# Patient Record
Sex: Female | Born: 1985 | Race: Black or African American | Hispanic: No | Marital: Single | Smoking: Current every day smoker
Health system: Southern US, Community
[De-identification: ages and names within clinical notes are randomized; demographics above are authoritative.]

## PROBLEM LIST (undated history)

## (undated) DIAGNOSIS — I10 Essential (primary) hypertension: Secondary | ICD-10-CM

## (undated) DIAGNOSIS — J302 Other seasonal allergic rhinitis: Secondary | ICD-10-CM

## (undated) HISTORY — PX: TUBAL LIGATION: SHX77

---
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
## 2003-06-28 ENCOUNTER — Emergency Department (HOSPITAL_COMMUNITY): Admission: EM | Admit: 2003-06-28 | Discharge: 2003-06-28 | Payer: Self-pay

## 2003-10-16 ENCOUNTER — Ambulatory Visit (HOSPITAL_COMMUNITY): Admission: RE | Admit: 2003-10-16 | Discharge: 2003-10-16 | Payer: Self-pay | Admitting: *Deleted

## 2004-01-12 ENCOUNTER — Ambulatory Visit (HOSPITAL_COMMUNITY): Admission: RE | Admit: 2004-01-12 | Discharge: 2004-01-12 | Payer: Self-pay | Admitting: *Deleted

## 2004-01-28 ENCOUNTER — Inpatient Hospital Stay (HOSPITAL_COMMUNITY): Admission: AD | Admit: 2004-01-28 | Discharge: 2004-01-30 | Payer: Self-pay | Admitting: Obstetrics & Gynecology

## 2004-07-27 ENCOUNTER — Emergency Department (HOSPITAL_COMMUNITY): Admission: EM | Admit: 2004-07-27 | Discharge: 2004-07-27 | Payer: Self-pay | Admitting: Emergency Medicine

## 2004-12-04 ENCOUNTER — Emergency Department (HOSPITAL_COMMUNITY): Admission: EM | Admit: 2004-12-04 | Discharge: 2004-12-04 | Payer: Self-pay | Admitting: Emergency Medicine

## 2005-04-09 ENCOUNTER — Inpatient Hospital Stay (HOSPITAL_COMMUNITY): Admission: AD | Admit: 2005-04-09 | Discharge: 2005-04-09 | Payer: Self-pay | Admitting: Obstetrics & Gynecology

## 2005-05-11 ENCOUNTER — Inpatient Hospital Stay (HOSPITAL_COMMUNITY): Admission: AD | Admit: 2005-05-11 | Discharge: 2005-05-11 | Payer: Self-pay | Admitting: Family Medicine

## 2005-05-15 ENCOUNTER — Emergency Department (HOSPITAL_COMMUNITY): Admission: EM | Admit: 2005-05-15 | Discharge: 2005-05-15 | Payer: Self-pay | Admitting: Emergency Medicine

## 2005-07-05 ENCOUNTER — Ambulatory Visit (HOSPITAL_COMMUNITY): Admission: RE | Admit: 2005-07-05 | Discharge: 2005-07-05 | Payer: Self-pay | Admitting: *Deleted

## 2005-09-29 ENCOUNTER — Ambulatory Visit (HOSPITAL_COMMUNITY): Admission: RE | Admit: 2005-09-29 | Discharge: 2005-09-29 | Payer: Self-pay | Admitting: *Deleted

## 2005-10-09 ENCOUNTER — Ambulatory Visit: Payer: Self-pay | Admitting: Family Medicine

## 2005-10-16 ENCOUNTER — Ambulatory Visit: Payer: Self-pay | Admitting: *Deleted

## 2005-10-18 ENCOUNTER — Inpatient Hospital Stay (HOSPITAL_COMMUNITY): Admission: AD | Admit: 2005-10-18 | Discharge: 2005-10-21 | Payer: Self-pay | Admitting: *Deleted

## 2005-10-18 ENCOUNTER — Ambulatory Visit: Payer: Self-pay | Admitting: Family Medicine

## 2007-01-13 ENCOUNTER — Inpatient Hospital Stay (HOSPITAL_COMMUNITY): Admission: AD | Admit: 2007-01-13 | Discharge: 2007-01-13 | Payer: Self-pay | Admitting: Obstetrics & Gynecology

## 2007-02-03 ENCOUNTER — Inpatient Hospital Stay (HOSPITAL_COMMUNITY): Admission: AD | Admit: 2007-02-03 | Discharge: 2007-02-03 | Payer: Self-pay | Admitting: Obstetrics & Gynecology

## 2007-02-06 ENCOUNTER — Ambulatory Visit (HOSPITAL_COMMUNITY): Admission: RE | Admit: 2007-02-06 | Discharge: 2007-02-06 | Payer: Self-pay | Admitting: Obstetrics & Gynecology

## 2007-02-08 ENCOUNTER — Ambulatory Visit (HOSPITAL_COMMUNITY): Admission: RE | Admit: 2007-02-08 | Discharge: 2007-02-08 | Payer: Self-pay | Admitting: Obstetrics & Gynecology

## 2007-03-17 ENCOUNTER — Inpatient Hospital Stay (HOSPITAL_COMMUNITY): Admission: AD | Admit: 2007-03-17 | Discharge: 2007-03-17 | Payer: Self-pay | Admitting: Obstetrics & Gynecology

## 2007-03-18 ENCOUNTER — Ambulatory Visit (HOSPITAL_COMMUNITY): Admission: RE | Admit: 2007-03-18 | Discharge: 2007-03-18 | Payer: Self-pay | Admitting: Obstetrics & Gynecology

## 2007-03-20 ENCOUNTER — Ambulatory Visit (HOSPITAL_COMMUNITY): Admission: RE | Admit: 2007-03-20 | Discharge: 2007-03-20 | Payer: Self-pay | Admitting: Family Medicine

## 2007-04-29 ENCOUNTER — Ambulatory Visit (HOSPITAL_COMMUNITY): Admission: RE | Admit: 2007-04-29 | Discharge: 2007-04-29 | Payer: Self-pay | Admitting: Family Medicine

## 2007-05-27 ENCOUNTER — Ambulatory Visit (HOSPITAL_COMMUNITY): Admission: RE | Admit: 2007-05-27 | Discharge: 2007-05-27 | Payer: Self-pay | Admitting: Family Medicine

## 2007-07-10 ENCOUNTER — Ambulatory Visit (HOSPITAL_COMMUNITY): Admission: RE | Admit: 2007-07-10 | Discharge: 2007-07-10 | Payer: Self-pay | Admitting: Family Medicine

## 2007-07-31 ENCOUNTER — Ambulatory Visit (HOSPITAL_COMMUNITY): Admission: RE | Admit: 2007-07-31 | Discharge: 2007-07-31 | Payer: Self-pay | Admitting: Family Medicine

## 2007-08-05 ENCOUNTER — Inpatient Hospital Stay (HOSPITAL_COMMUNITY): Admission: AD | Admit: 2007-08-05 | Discharge: 2007-08-07 | Payer: Self-pay | Admitting: Obstetrics & Gynecology

## 2007-08-05 ENCOUNTER — Ambulatory Visit: Payer: Self-pay | Admitting: *Deleted

## 2007-08-06 ENCOUNTER — Ambulatory Visit: Payer: Self-pay | Admitting: Obstetrics & Gynecology

## 2007-08-09 ENCOUNTER — Inpatient Hospital Stay (HOSPITAL_COMMUNITY): Admission: AD | Admit: 2007-08-09 | Discharge: 2007-08-10 | Payer: Self-pay | Admitting: Obstetrics and Gynecology

## 2007-08-09 ENCOUNTER — Ambulatory Visit: Payer: Self-pay | Admitting: Obstetrics and Gynecology

## 2008-06-25 ENCOUNTER — Emergency Department (HOSPITAL_COMMUNITY): Admission: EM | Admit: 2008-06-25 | Discharge: 2008-06-25 | Payer: Self-pay | Admitting: Emergency Medicine

## 2008-09-27 ENCOUNTER — Emergency Department (HOSPITAL_COMMUNITY): Admission: EM | Admit: 2008-09-27 | Discharge: 2008-09-27 | Payer: Self-pay | Admitting: Family Medicine

## 2008-12-29 ENCOUNTER — Emergency Department (HOSPITAL_COMMUNITY): Admission: EM | Admit: 2008-12-29 | Discharge: 2008-12-29 | Payer: Self-pay | Admitting: Emergency Medicine

## 2009-01-05 ENCOUNTER — Emergency Department (HOSPITAL_COMMUNITY): Admission: EM | Admit: 2009-01-05 | Discharge: 2009-01-05 | Payer: Self-pay | Admitting: Emergency Medicine

## 2009-03-24 ENCOUNTER — Emergency Department (HOSPITAL_COMMUNITY): Admission: EM | Admit: 2009-03-24 | Discharge: 2009-03-24 | Payer: Self-pay | Admitting: Family Medicine

## 2009-12-20 ENCOUNTER — Emergency Department (HOSPITAL_COMMUNITY): Admission: EM | Admit: 2009-12-20 | Discharge: 2009-12-21 | Payer: Self-pay | Admitting: Emergency Medicine

## 2010-03-29 ENCOUNTER — Emergency Department (HOSPITAL_COMMUNITY): Admission: EM | Admit: 2010-03-29 | Discharge: 2010-03-29 | Payer: Self-pay | Admitting: Emergency Medicine

## 2010-06-02 ENCOUNTER — Emergency Department (HOSPITAL_COMMUNITY): Admission: EM | Admit: 2010-06-02 | Discharge: 2010-06-02 | Payer: Self-pay | Admitting: Emergency Medicine

## 2010-12-04 ENCOUNTER — Encounter: Payer: Self-pay | Admitting: *Deleted

## 2011-01-28 LAB — POCT RAPID STREP A (OFFICE): Streptococcus, Group A Screen (Direct): NEGATIVE

## 2011-01-30 LAB — URINALYSIS, ROUTINE W REFLEX MICROSCOPIC
Hgb urine dipstick: NEGATIVE
Ketones, ur: NEGATIVE mg/dL
Nitrite: NEGATIVE
Urobilinogen, UA: 1 mg/dL (ref 0.0–1.0)

## 2011-01-30 LAB — DIFFERENTIAL
Eosinophils Absolute: 0.1 10*3/uL (ref 0.0–0.7)
Eosinophils Relative: 2 % (ref 0–5)
Lymphocytes Relative: 35 % (ref 12–46)
Lymphs Abs: 2.4 10*3/uL (ref 0.7–4.0)
Monocytes Relative: 8 % (ref 3–12)
Neutrophils Relative %: 56 % (ref 43–77)

## 2011-01-30 LAB — HEPATIC FUNCTION PANEL
ALT: 19 U/L (ref 0–35)
Albumin: 4 g/dL (ref 3.5–5.2)
Alkaline Phosphatase: 21 U/L — ABNORMAL LOW (ref 39–117)
Total Bilirubin: 0.8 mg/dL (ref 0.3–1.2)
Total Protein: 7.5 g/dL (ref 6.0–8.3)

## 2011-01-30 LAB — POCT I-STAT, CHEM 8
Calcium, Ion: 1.12 mmol/L (ref 1.12–1.32)
Chloride: 104 mEq/L (ref 96–112)
Creatinine, Ser: 0.9 mg/dL (ref 0.4–1.2)
Glucose, Bld: 102 mg/dL — ABNORMAL HIGH (ref 70–99)
HCT: 45 % (ref 36.0–46.0)
Potassium: 3.7 mEq/L (ref 3.5–5.1)

## 2011-01-30 LAB — CBC
HCT: 41 % (ref 36.0–46.0)
MCV: 89.3 fL (ref 78.0–100.0)
Platelets: 227 10*3/uL (ref 150–400)
RBC: 4.58 MIL/uL (ref 3.87–5.11)
WBC: 6.8 10*3/uL (ref 4.0–10.5)

## 2011-01-30 LAB — URINE MICROSCOPIC-ADD ON

## 2011-01-30 LAB — LIPASE, BLOOD: Lipase: 27 U/L (ref 11–59)

## 2011-02-21 LAB — GC/CHLAMYDIA PROBE AMP, GENITAL
Chlamydia, DNA Probe: NEGATIVE
GC Probe Amp, Genital: NEGATIVE

## 2011-02-21 LAB — POCT URINALYSIS DIP (DEVICE)
Bilirubin Urine: NEGATIVE
Glucose, UA: NEGATIVE mg/dL
Hgb urine dipstick: NEGATIVE
Ketones, ur: 15 mg/dL — AB
Specific Gravity, Urine: 1.025 (ref 1.005–1.030)
pH: 6.5 (ref 5.0–8.0)

## 2011-02-21 LAB — WET PREP, GENITAL: Trich, Wet Prep: NONE SEEN

## 2011-02-28 LAB — POCT URINALYSIS DIP (DEVICE)
Ketones, ur: NEGATIVE mg/dL
Nitrite: NEGATIVE
Protein, ur: NEGATIVE mg/dL
Urobilinogen, UA: 1 mg/dL (ref 0.0–1.0)
pH: 7 (ref 5.0–8.0)

## 2011-02-28 LAB — POCT PREGNANCY, URINE: Preg Test, Ur: NEGATIVE

## 2011-02-28 LAB — WET PREP, GENITAL: Trich, Wet Prep: NONE SEEN

## 2011-02-28 LAB — GC/CHLAMYDIA PROBE AMP, GENITAL: GC Probe Amp, Genital: NEGATIVE

## 2011-03-28 NOTE — Op Note (Signed)
NAMEJANELE, Heather Farrell                 ACCOUNT NO.:  [id number]   MEDICAL RECORD NO.:  [id number]          PATIENT TYPE:  INP   LOCATION:  9102                          FACILITY:  [facility]   PHYSICIAN:  Allie Bossier, MD        DATE OF BIRTH:  [birth year]   DATE OF PROCEDURE:  08/06/2007  DATE OF DISCHARGE:                               OPERATIVE REPORT   PREOPERATIVE DIAGNOSIS:  Desires permanent sterilization.   POSTOPERATIVE DIAGNOSIS:  Desires permanent sterilization.   PROCEDURE:  Postpartum bilateral tubal ligation with Filshie clips.   SURGEON:  Allie Bossier, M.D.   ASSISTANT:  Paticia Stack, M.D.   ANESTHESIA:  MAC and local.   COMPLICATIONS:  None.   ESTIMATED BLOOD LOSS:  Minimal.   FINDINGS:  Normal uterus, tubes and ovaries.   INDICATIONS FOR PROCEDURE:  This is a 25 year old gravida 4, para 4-0-0-  4 status post normal spontaneous vaginal delivery who desires permanent  sterilization.  The risks and benefits of the procedure were discussed  with the patient including the risk of failure of 1 in 100 and increased  risk of ectopic gestation if pregnancy occurs.   DESCRIPTION OF PROCEDURE:  The patient was taken to the operating room  where she was put under MAC anesthesia. She was prepped and draped in a  normal sterile fashion.  A small transverse infraumbilical skin incision  was made with the scalpel.  The incision was carried down through the  underlying fascia until the peritoneum was identified and entered. The  peritoneum was noted to be free of any adhesions and the incision was  extended with the Metzenbaum scissors.  The patient's right fallopian  tube was then identified, brought to the incision, and grasped with a  Babcock clamp.  This tube was then followed out to its individual  fimbria.  The Babcock clamp was then used to grasp the tube  approximately 4 cm from the cornual region and a Filshie clip was placed  in the isthmus region of the tube.  0.5 mL of  0.25% Marcaine was  injected on either side of the clip with excellent hemostasis noted.  The tube was returned to the abdomen. The patient's left fallopian tube  was then identified, brought to the incision, and grasped with the  Babcock clamp.  This tube was also followed out to its individual  fimbria.  The Babcock clamps were used to grasp the tube approximately 4  cm from the cornual region and a Filshie clip was placed in the isthmic  region of this tube. 0.5 mL of 0.25% Marcaine was also injected on  either side of the Filshie clip with good hemostasis noted.  The tube  was returned to the abdomen. The peritoneum and fascia was then closed  in a single layer using 3-0 Vicryl.  The skin was closed in a  subcuticular fashion using 3-0 Vicryl on the PAS2 needle. The patient  tolerated the procedure well.  Sponge, lap and needle counts were  correct x2.  The patient was taken to the recovery  room in stable  condition.     ______________________________  Paticia Stack, MD      Allie Bossier, MD  Electronically Signed    LNJ/MEDQ  D:  08/06/2007  T:  08/06/2007  Job:  [id number]

## 2011-08-11 LAB — URINALYSIS, ROUTINE W REFLEX MICROSCOPIC
Glucose, UA: NEGATIVE
Protein, ur: NEGATIVE
Specific Gravity, Urine: 1.026
Urobilinogen, UA: 1

## 2011-08-11 LAB — PREGNANCY, URINE: Preg Test, Ur: NEGATIVE

## 2011-08-24 LAB — RPR: RPR Ser Ql: NONREACTIVE

## 2011-11-01 ENCOUNTER — Emergency Department (INDEPENDENT_AMBULATORY_CARE_PROVIDER_SITE_OTHER)
Admission: EM | Admit: 2011-11-01 | Discharge: 2011-11-01 | Disposition: A | Discharge status: Home / Self Care | Payer: Self-pay | Source: Home / Self Care | Attending: Family Medicine | Admitting: Family Medicine

## 2011-11-01 ENCOUNTER — Encounter: Payer: Self-pay | Admitting: Emergency Medicine

## 2011-11-01 DIAGNOSIS — K047 Periapical abscess without sinus: Secondary | ICD-10-CM

## 2011-11-01 MED ORDER — IBUPROFEN 800 MG PO TABS: 800.0000 mg | ORAL_TABLET | Freq: Three times a day (TID) | ORAL | Status: AC

## 2011-11-01 MED ORDER — AMOXICILLIN 500 MG PO CAPS: 500.0000 mg | ORAL_CAPSULE | Freq: Three times a day (TID) | ORAL | Status: AC

## 2011-11-01 NOTE — ED Notes (Signed)
PT HERE WITH RIGHT UPPER TOOTH ABSCESS WITH SWELLING THAT STARTED X 4 DYS AGO.PT DENIES PAIN OR FEVER BUT STATES I CAN TASTE PUS DRAINAGE.NO REPORTS OF MALAISE

## 2011-11-01 NOTE — ED Provider Notes (Signed)
History     CSN: [id number] Arrival date & time: 11/01/2011  2:25 PM   First MD Initiated Contact with Patient 11/01/11 1342      Chief Complaint  Patient presents with  . Oral Swelling  . Abscess    (Consider location/radiation/quality/duration/timing/severity/associated sxs/prior treatment) Patient is a 24 y.o. female presenting with abscess. The history is provided by the patient.  Abscess  This is a new problem. The current episode started less than one week ago. The onset was gradual. The problem has been gradually worsening. The problem is mild. Pertinent negatives include no fever, no rhinorrhea and no sore throat.    History reviewed. No pertinent past medical history.  History reviewed. No pertinent past surgical history.  No family history on file.  History  Substance Use Topics  . Smoking status: Current Everyday Smoker  . Smokeless tobacco: Not on file  . Alcohol Use: Yes    OB History    Grav Para Term Preterm Abortions TAB SAB Ect Mult Living                  Review of Systems  Constitutional: Negative for fever.  HENT: Positive for dental problem. Negative for sore throat and rhinorrhea.     Allergies  Review of patient's allergies indicates no known allergies.  Home Medications   Current Outpatient Rx  Name Route Sig Dispense Refill  . AMOXICILLIN 500 MG PO CAPS Oral Take 1 capsule (500 mg total) by mouth 3 (three) times daily. 30 capsule 0  . IBUPROFEN 800 MG PO TABS Oral Take 1 tablet (800 mg total) by mouth 3 (three) times daily. 30 tablet 0    BP 120/53  Pulse 62  Temp(Src) 98 F (36.7 C) (Oral)  Resp 15  SpO2 100%  Physical Exam  Nursing note and vitals reviewed. Constitutional: She appears well-developed and well-nourished.  HENT:  Head: Normocephalic.  Right Ear: External ear normal.  Left Ear: External ear normal.  Mouth/Throat: Oropharynx is clear and moist.    Lymphadenopathy:    She has no cervical adenopathy.     ED Course  Procedures (including critical care time)  Labs Reviewed - No data to display No results found.   1. Dental abscess       MDM          Barkley Bruns, MD 11/01/11 1630

## 2012-01-04 ENCOUNTER — Emergency Department (HOSPITAL_COMMUNITY)
Admission: EM | Admit: 2012-01-04 | Discharge: 2012-01-04 | Disposition: A | Discharge status: Home Health | Payer: Self-pay | Source: Home / Self Care | Attending: Emergency Medicine | Admitting: Emergency Medicine

## 2012-01-04 ENCOUNTER — Encounter (HOSPITAL_COMMUNITY): Payer: Self-pay | Admitting: Emergency Medicine

## 2012-01-04 DIAGNOSIS — J069 Acute upper respiratory infection, unspecified: Secondary | ICD-10-CM

## 2012-01-04 HISTORY — DX: Other seasonal allergic rhinitis: J30.2

## 2012-01-04 MED ORDER — CETIRIZINE-PSEUDOEPHEDRINE ER 5-120 MG PO TB12: 1.0000 | ORAL_TABLET | Freq: Every day | ORAL | Status: DC

## 2012-01-04 MED ORDER — GUAIFENESIN-CODEINE 100-10 MG/5ML PO SYRP: 5.0000 mL | ORAL_SOLUTION | Freq: Three times a day (TID) | ORAL | Status: AC | PRN

## 2012-01-04 NOTE — Discharge Instructions (Signed)
Antibiotic Nonuse ° Your caregiver felt that the infection or problem was not one that would be helped with an antibiotic. °Infections may be caused by viruses or bacteria. Only a caregiver can tell which one of these is the likely cause of an illness. A cold is the most common cause of infection in both adults and children. A cold is a virus. Antibiotic treatment will have no effect on a viral infection. Viruses can lead to many lost days of work caring for sick children and many missed days of school. Children may catch as many as 10 "colds" or "flus" per year during which they can be tearful, cranky, and uncomfortable. The goal of treating a virus is aimed at keeping the ill person comfortable. °Antibiotics are medications used to help the body fight bacterial infections. There are relatively few types of bacteria that cause infections but there are hundreds of viruses. While both viruses and bacteria cause infection they are very different types of germs. A viral infection will typically go away by itself within 7 to 10 days. Bacterial infections may spread or get worse without antibiotic treatment. °Examples of bacterial infections are: °· Sore throats (like strep throat or tonsillitis).  °· Infection in the lung (pneumonia).  °· Ear and skin infections.  °Examples of viral infections are: °· Colds or flus.  °· Most coughs and bronchitis.  °· Sore throats not caused by Strep.  °· Runny noses.  °It is often best not to take an antibiotic when a viral infection is the cause of the problem. Antibiotics can kill off the helpful bacteria that we have inside our body and allow harmful bacteria to start growing. Antibiotics can cause side effects such as allergies, nausea, and diarrhea without helping to improve the symptoms of the viral infection. Additionally, repeated uses of antibiotics can cause bacteria inside of our body to become resistant. That resistance can be passed onto harmful bacterial. The next time  you have an infection it may be harder to treat if antibiotics are used when they are not needed. Not treating with antibiotics allows our own immune system to develop and take care of infections more efficiently. Also, antibiotics will work better for us when they are prescribed for bacterial infections. °Treatments for a child that is ill may include: °· Give extra fluids throughout the day to stay hydrated.  °· Get plenty of rest.  °· Only give your child over-the-counter or prescription medicines for pain, discomfort, or fever as directed by your caregiver.  °· The use of a cool mist humidifier may help stuffy noses.  °· Cold medications if suggested by your caregiver.  °Your caregiver may decide to start you on an antibiotic if: °· The problem you were seen for today continues for a longer length of time than expected.  °· You develop a secondary bacterial infection.  °SEEK MEDICAL CARE IF: °· Fever lasts longer than 5 days.  °· Symptoms continue to get worse after 5 to 7 days or become severe.  °· Difficulty in breathing develops.  °· Signs of dehydration develop (poor drinking, rare urinating, dark colored urine).  °· Changes in behavior or worsening tiredness (listlessness or lethargy).  °Document Released: 01/08/2002 Document Revised: 07/12/2011 Document Reviewed: 07/07/2009 °Exit[employer]® Patient Information ©2012 [employer].Antibiotic Nonuse ° Your caregiver felt that the infection or problem was not one that would be helped with an antibiotic. °Infections may be caused by viruses or bacteria. Only a caregiver can tell which one of these   is the likely cause of an illness. A cold is the most common cause of infection in both adults and children. A cold is a virus. Antibiotic treatment will have no effect on a viral infection. Viruses can lead to many lost days of work caring for sick children and many missed days of school. Children may catch as many as 10 "colds" or "flus" per year during which they can be  tearful, cranky, and uncomfortable. The goal of treating a virus is aimed at keeping the ill person comfortable. °Antibiotics are medications used to help the body fight bacterial infections. There are relatively few types of bacteria that cause infections but there are hundreds of viruses. While both viruses and bacteria cause infection they are very different types of germs. A viral infection will typically go away by itself within 7 to 10 days. Bacterial infections may spread or get worse without antibiotic treatment. °Examples of bacterial infections are: °· Sore throats (like strep throat or tonsillitis).  °· Infection in the lung (pneumonia).  °· Ear and skin infections.  °Examples of viral infections are: °· Colds or flus.  °· Most coughs and bronchitis.  °· Sore throats not caused by Strep.  °· Runny noses.  °It is often best not to take an antibiotic when a viral infection is the cause of the problem. Antibiotics can kill off the helpful bacteria that we have inside our body and allow harmful bacteria to start growing. Antibiotics can cause side effects such as allergies, nausea, and diarrhea without helping to improve the symptoms of the viral infection. Additionally, repeated uses of antibiotics can cause bacteria inside of our body to become resistant. That resistance can be passed onto harmful bacterial. The next time you have an infection it may be harder to treat if antibiotics are used when they are not needed. Not treating with antibiotics allows our own immune system to develop and take care of infections more efficiently. Also, antibiotics will work better for us when they are prescribed for bacterial infections. °Treatments for a child that is ill may include: °· Give extra fluids throughout the day to stay hydrated.  °· Get plenty of rest.  °· Only give your child over-the-counter or prescription medicines for pain, discomfort, or fever as directed by your caregiver.  °· The use of a cool mist  humidifier may help stuffy noses.  °· Cold medications if suggested by your caregiver.  °Your caregiver may decide to start you on an antibiotic if: °· The problem you were seen for today continues for a longer length of time than expected.  °· You develop a secondary bacterial infection.  °SEEK MEDICAL CARE IF: °· Fever lasts longer than 5 days.  °· Symptoms continue to get worse after 5 to 7 days or become severe.  °· Difficulty in breathing develops.  °· Signs of dehydration develop (poor drinking, rare urinating, dark colored urine).  °· Changes in behavior or worsening tiredness (listlessness or lethargy).  °Document Released: 01/08/2002 Document Revised: 07/12/2011 Document Reviewed: 07/07/2009 °ExitCare® Patient Information ©2012 [employer]. °

## 2012-01-04 NOTE — ED Notes (Signed)
Runny nose, cough, productive cough.  Onset Sunday of symptoms.  Patient reports scratchy throat.

## 2012-01-04 NOTE — ED Provider Notes (Signed)
History     CSN: [id number]  Arrival date & time 01/04/12  1755   First MD Initiated Contact with Patient 01/04/12 1816      Chief Complaint  Patient presents with  . URI    (Consider location/radiation/quality/duration/timing/severity/associated sxs/prior treatment) HPI Comments: Patient presents urgent care complaining of a productive cough with runny nose, symptoms started since Sunday. Has also a mild sore throat more like a burning sensation. No short of breath, no fevers today he had some fevers 2 days ago. Boyfriend has similar symptoms were ongoing for more than 2 weeks.  Patient is a 26 y.o. female presenting with URI. The history is provided by the patient.  URI The primary symptoms include fever, fatigue, ear pain, sore throat and cough. Primary symptoms do not include wheezing, abdominal pain, vomiting, myalgias or rash. The current episode started 3 to 5 days ago. This is a new problem.  The sore throat is not accompanied by trouble swallowing.  Symptoms associated with the illness include congestion and rhinorrhea.    Past Medical History  Diagnosis Date  . Seasonal allergies     History reviewed. No pertinent past surgical history.  No family history on file.  History  Substance Use Topics  . Smoking status: Current Everyday Smoker  . Smokeless tobacco: Not on file  . Alcohol Use: Yes    OB History    Grav Para Term Preterm Abortions TAB SAB Ect Mult Living                  Review of Systems  Constitutional: Positive for fever, appetite change and fatigue.  HENT: Positive for ear pain, congestion, sore throat and rhinorrhea. Negative for trouble swallowing.   Respiratory: Positive for cough. Negative for wheezing.   Gastrointestinal: Negative for vomiting and abdominal pain.  Musculoskeletal: Negative for myalgias.  Skin: Negative for rash.    Allergies  Review of patient's allergies indicates no known allergies.  Home Medications   Current  Outpatient Rx  Name Route Sig Dispense Refill  . OVER THE COUNTER MEDICATION  Sinus medicine    . CETIRIZINE-PSEUDOEPHEDRINE ER 5-120 MG PO TB12 Oral Take 1 tablet by mouth daily. 15 tablet 0  . GUAIFENESIN-CODEINE 100-10 MG/5ML PO SYRP Oral Take 5 mLs by mouth 3 (three) times daily as needed for cough. 120 mL 0    BP 133/89  Pulse 64  Temp(Src) 98.6 F (37 C) (Oral)  Resp 16  SpO2 100%  LMP 12/04/2011  Physical Exam  Nursing note and vitals reviewed. Constitutional: She appears well-developed and well-nourished.  HENT:  Head: Normocephalic.  Right Ear: Tympanic membrane normal.  Left Ear: Tympanic membrane normal.  Mouth/Throat: Uvula is midline, oropharynx is clear and moist and mucous membranes are normal. No oropharyngeal exudate.  Eyes: Conjunctivae are normal. Right eye exhibits no discharge.  Neck: Neck supple.  Pulmonary/Chest: No respiratory distress. She has no wheezes. She has no rales. She exhibits no tenderness.  Abdominal: Soft.  Musculoskeletal: She exhibits no edema.  Lymphadenopathy:    She has no cervical adenopathy.  Skin: Skin is warm. No erythema.    ED Course  Procedures (including critical care time)  Labs Reviewed - No data to display No results found.   1. Upper respiratory infection       MDM  Patient with upper respiratory symptoms for 3 days. Normal lung exam afebrile looks comfortable with sporadic dry cough        Jimmie Molly, MD 01/04/12 2031

## 2012-10-26 ENCOUNTER — Emergency Department (HOSPITAL_COMMUNITY)
Admission: EM | Admit: 2012-10-26 | Discharge: 2012-10-26 | Disposition: A | Discharge status: Home / Self Care | Payer: Self-pay | Attending: Emergency Medicine | Admitting: Emergency Medicine

## 2012-10-26 ENCOUNTER — Encounter (HOSPITAL_COMMUNITY): Payer: Self-pay | Admitting: Emergency Medicine

## 2012-10-26 DIAGNOSIS — R109 Unspecified abdominal pain: Secondary | ICD-10-CM | POA: Insufficient documentation

## 2012-10-26 DIAGNOSIS — R102 Pelvic and perineal pain: Secondary | ICD-10-CM

## 2012-10-26 DIAGNOSIS — R110 Nausea: Secondary | ICD-10-CM

## 2012-10-26 DIAGNOSIS — N949 Unspecified condition associated with female genital organs and menstrual cycle: Secondary | ICD-10-CM | POA: Insufficient documentation

## 2012-10-26 DIAGNOSIS — F17200 Nicotine dependence, unspecified, uncomplicated: Secondary | ICD-10-CM | POA: Insufficient documentation

## 2012-10-26 DIAGNOSIS — J309 Allergic rhinitis, unspecified: Secondary | ICD-10-CM | POA: Insufficient documentation

## 2012-10-26 DIAGNOSIS — R112 Nausea with vomiting, unspecified: Secondary | ICD-10-CM | POA: Insufficient documentation

## 2012-10-26 DIAGNOSIS — R51 Headache: Secondary | ICD-10-CM | POA: Insufficient documentation

## 2012-10-26 LAB — WET PREP, GENITAL
Clue Cells Wet Prep HPF POC: NONE SEEN
Trich, Wet Prep: NONE SEEN
WBC, Wet Prep HPF POC: NONE SEEN
Yeast Wet Prep HPF POC: NONE SEEN

## 2012-10-26 LAB — CBC WITH DIFFERENTIAL/PLATELET
Basophils Relative: 0 % (ref 0–1)
Eosinophils Absolute: 0.1 10*3/uL (ref 0.0–0.7)
Eosinophils Relative: 2 % (ref 0–5)
Hemoglobin: 14 g/dL (ref 12.0–15.0)
MCH: 29.5 pg (ref 26.0–34.0)
MCHC: 33.8 g/dL (ref 30.0–36.0)
MCV: 87.3 fL (ref 78.0–100.0)
Monocytes Absolute: 0.5 10*3/uL (ref 0.1–1.0)
Monocytes Relative: 8 % (ref 3–12)
Neutrophils Relative %: 51 % (ref 43–77)

## 2012-10-26 LAB — COMPREHENSIVE METABOLIC PANEL
Albumin: 4.4 g/dL (ref 3.5–5.2)
BUN: 18 mg/dL (ref 6–23)
Calcium: 9.8 mg/dL (ref 8.4–10.5)
Creatinine, Ser: 1.01 mg/dL (ref 0.50–1.10)
GFR calc Af Amer: 87 mL/min — ABNORMAL LOW (ref >90)
Potassium: 3.5 mEq/L (ref 3.5–5.1)
Total Protein: 8.6 g/dL — ABNORMAL HIGH (ref 6.0–8.3)

## 2012-10-26 LAB — URINALYSIS, ROUTINE W REFLEX MICROSCOPIC
Bilirubin Urine: NEGATIVE
Ketones, ur: 15 mg/dL — AB
Nitrite: NEGATIVE
pH: 6.5 (ref 5.0–8.0)

## 2012-10-26 LAB — POCT PREGNANCY, URINE: Preg Test, Ur: NEGATIVE

## 2012-10-26 MED ORDER — ONDANSETRON 4 MG PO TBDP
4.0000 mg | ORAL_TABLET | Freq: Once | ORAL | Status: AC
  Administered 2012-10-26: 4 mg via ORAL
  Filled 2012-10-26: qty 1

## 2012-10-26 MED ORDER — PROMETHAZINE HCL 25 MG PO TABS: 25.0000 mg | ORAL_TABLET | Freq: Four times a day (QID) | ORAL | Status: DC | PRN

## 2012-10-26 NOTE — ED Provider Notes (Signed)
History     CSN: [id number]  Arrival date & time 10/26/12  1958   First MD Initiated Contact with Patient 10/26/12 2108      Chief Complaint  Patient presents with  . Headache  . Emesis  . Abdominal Pain    (Consider location/radiation/quality/duration/timing/severity/associated sxs/prior treatment) HPI Pt with multiple complaints, mostly chronic including headaches off and on for several years, improved with Tylenol at home, no new pattern or symptoms for same. She also has occasional lower abdominal cramping off and on for months, no changes and none today. She has had more recent complaints of nausea and vomiting, more frequent earlier this week but no vomiting today. She reports one episode of lower abdominal pain after sexual intercourse followed by small amount of vaginal bleeding last week, but no further bleeding or discharge now. She had a normal menses about 2 weeks ago. Despite all of the above, her primary reason for coming to the ED was to be evaluated for vaginal odor.   Past Medical History  Diagnosis Date  . Seasonal allergies     History reviewed. No pertinent past surgical history.  No family history on file.  History  Substance Use Topics  . Smoking status: Current Every Day Smoker  . Smokeless tobacco: Not on file  . Alcohol Use: Yes    OB History    Grav Para Term Preterm Abortions TAB SAB Ect Mult Living                  Review of Systems All other systems reviewed and are negative except as noted in HPI.   Allergies  Review of patient's allergies indicates no known allergies.  Home Medications  No current outpatient prescriptions on file.  BP 131/73  Pulse 82  Temp 99 F (37.2 C) (Oral)  Resp 16  Ht 5\' 2"  (1.575 m)  Wt 150 lb (68.04 kg)  BMI 27.44 kg/m2  SpO2 100%  LMP 10/07/2012  Physical Exam  Nursing note and vitals reviewed. Constitutional: She is oriented to person, place, and time. She appears well-developed and  well-nourished.  HENT:  Head: Normocephalic and atraumatic.  Eyes: EOM are normal. Pupils are equal, round, and reactive to light.  Neck: Normal range of motion. Neck supple.  Cardiovascular: Normal rate, normal heart sounds and intact distal pulses.   Pulmonary/Chest: Effort normal and breath sounds normal.  Abdominal: Bowel sounds are normal. She exhibits no distension. There is no tenderness.  Genitourinary: Cervix exhibits no motion tenderness and no discharge. Right adnexum displays no mass and no tenderness. Left adnexum displays no mass and no tenderness. No bleeding around the vagina. No vaginal discharge found.  Musculoskeletal: Normal range of motion. She exhibits no edema and no tenderness.  Neurological: She is alert and oriented to person, place, and time. She has normal strength. No cranial nerve deficit or sensory deficit.  Skin: Skin is warm and dry. No rash noted.  Psychiatric: She has a normal mood and affect.    ED Course  Procedures (including critical care time)  Labs Reviewed  URINALYSIS, ROUTINE W REFLEX MICROSCOPIC - Abnormal; Notable for the following:    Specific Gravity, Urine 1.031 (*)     Ketones, ur 15 (*)     All other components within normal limits  COMPREHENSIVE METABOLIC PANEL - Abnormal; Notable for the following:    Total Protein 8.6 (*)     Alkaline Phosphatase 32 (*)     Total Bilirubin 0.2 (*)  GFR calc non Af Amer 75 (*)     GFR calc Af Amer 87 (*)     All other components within normal limits  CBC WITH DIFFERENTIAL  POCT PREGNANCY, URINE  WET PREP, GENITAL  GC/CHLAMYDIA PROBE AMP   No results found.   No diagnosis found.    MDM  Pt has labs done in triage which were normal. Pelvic exam is also normal. No discharge or odor. Wet prep negative. Given Zofran for nausea. Advised to establish with PCP for evaluation and management of her chronic complaints.         Naveen Clardy B. Bernette Mayers, MD 10/26/12 2155

## 2012-10-26 NOTE — ED Notes (Signed)
The patient is AOx4 and comfortable with her discharge instructions. 

## 2012-10-26 NOTE — ED Notes (Addendum)
Patient with headache, vomiting and abdominal pain.  Patient states that the headache has been on and off for two years.  The abdominal pain started a week to two weeks ago.  Patient does have nausea and vomiting.  Patient also states she does have some vaginal odor at this time.

## 2013-03-03 ENCOUNTER — Emergency Department (HOSPITAL_COMMUNITY)
Admission: EM | Admit: 2013-03-03 | Discharge: 2013-03-03 | Disposition: A | Discharge status: Home / Self Care | Payer: Self-pay | Attending: Emergency Medicine | Admitting: Emergency Medicine

## 2013-03-03 ENCOUNTER — Encounter (HOSPITAL_COMMUNITY): Payer: Self-pay | Admitting: Emergency Medicine

## 2013-03-03 DIAGNOSIS — R1110 Vomiting, unspecified: Secondary | ICD-10-CM

## 2013-03-03 DIAGNOSIS — R112 Nausea with vomiting, unspecified: Secondary | ICD-10-CM | POA: Insufficient documentation

## 2013-03-03 DIAGNOSIS — F17200 Nicotine dependence, unspecified, uncomplicated: Secondary | ICD-10-CM | POA: Insufficient documentation

## 2013-03-03 DIAGNOSIS — Z3202 Encounter for pregnancy test, result negative: Secondary | ICD-10-CM | POA: Insufficient documentation

## 2013-03-03 DIAGNOSIS — IMO0002 Reserved for concepts with insufficient information to code with codable children: Secondary | ICD-10-CM | POA: Insufficient documentation

## 2013-03-03 LAB — WET PREP, GENITAL
Trich, Wet Prep: NONE SEEN
Yeast Wet Prep HPF POC: NONE SEEN

## 2013-03-03 LAB — URINALYSIS, ROUTINE W REFLEX MICROSCOPIC
Bilirubin Urine: NEGATIVE
Glucose, UA: NEGATIVE mg/dL
Ketones, ur: NEGATIVE mg/dL
Leukocytes, UA: NEGATIVE
Protein, ur: NEGATIVE mg/dL
pH: 8 (ref 5.0–8.0)

## 2013-03-03 LAB — CBC WITH DIFFERENTIAL/PLATELET
Basophils Absolute: 0 10*3/uL (ref 0.0–0.1)
Eosinophils Absolute: 0.1 10*3/uL (ref 0.0–0.7)
Eosinophils Relative: 2 % (ref 0–5)
HCT: 36.7 % (ref 36.0–46.0)
MCH: 29.5 pg (ref 26.0–34.0)
MCV: 85.2 fL (ref 78.0–100.0)
Monocytes Absolute: 0.3 10*3/uL (ref 0.1–1.0)
Platelets: 255 10*3/uL (ref 150–400)
RDW: 13.4 % (ref 11.5–15.5)

## 2013-03-03 LAB — POCT I-STAT, CHEM 8
Calcium, Ion: 1.22 mmol/L (ref 1.12–1.23)
Chloride: 107 mEq/L (ref 96–112)
Creatinine, Ser: 0.9 mg/dL (ref 0.50–1.10)
Glucose, Bld: 72 mg/dL (ref 70–99)
HCT: 39 % (ref 36.0–46.0)
Potassium: 4.2 mEq/L (ref 3.5–5.1)

## 2013-03-03 MED ORDER — CEFTRIAXONE SODIUM 250 MG IJ SOLR
250.0000 mg | Freq: Once | INTRAMUSCULAR | Status: AC
  Administered 2013-03-03: 250 mg via INTRAMUSCULAR
  Filled 2013-03-03: qty 250

## 2013-03-03 MED ORDER — LIDOCAINE HCL (PF) 1 % IJ SOLN
INTRAMUSCULAR | Status: AC
  Filled 2013-03-03: qty 5

## 2013-03-03 MED ORDER — AZITHROMYCIN 250 MG PO TABS
1000.0000 mg | ORAL_TABLET | Freq: Once | ORAL | Status: AC
  Administered 2013-03-03: 1000 mg via ORAL
  Filled 2013-03-03: qty 4

## 2013-03-03 MED ORDER — PROMETHAZINE HCL 25 MG PO TABS: 25.0000 mg | ORAL_TABLET | Freq: Four times a day (QID) | ORAL | Status: DC | PRN

## 2013-03-03 NOTE — ED Provider Notes (Signed)
History     CSN: [id number]  Arrival date & time 03/03/13  1735   First MD Initiated Contact with Patient 03/03/13 2124      Chief Complaint  Patient presents with  . Emesis    (Consider location/radiation/quality/duration/timing/severity/associated sxs/prior treatment) HPI Comments: Patient is a 27 year old female who presents today with nausea and vomiting for the past 3 weeks. She states it happens 5 hours after she eats food and has not been able to hold anything down for the past 3 weeks. The emesis is stomach contents, no blood. Sometimes she can tolerate bread and crackers. This is not associated with abdominal pain. Yesterday she tried Pepto-Bismol with no relief. She also admits to foul smelling discharge, dyspareunia. She states she has not had any new sexual partners in the past year, he recently was released from prison. No fevers, chills, chest pain, shortness of breath, abdominal pain, dysuria. The history is provided by the patient. No language interpreter was used.    Past Medical History  Diagnosis Date  . Seasonal allergies     History reviewed. No pertinent past surgical history.  No family history on file.  History  Substance Use Topics  . Smoking status: Current Every Day Smoker  . Smokeless tobacco: Not on file  . Alcohol Use: Yes    OB History   Grav Para Term Preterm Abortions TAB SAB Ect Mult Living                  Review of Systems  Constitutional: Negative for fever and chills.  Respiratory: Negative for shortness of breath.   Cardiovascular: Negative for chest pain.  Gastrointestinal: Positive for nausea and vomiting.  Genitourinary: Positive for dyspareunia. Negative for dysuria and menstrual problem.  All other systems reviewed and are negative.    Allergies  Review of patient's allergies indicates no known allergies.  Home Medications   Current Outpatient Rx  Name  Route  Sig  Dispense  Refill  . promethazine (PHENERGAN) 25 MG  tablet   Oral   Take 1 tablet (25 mg total) by mouth every 6 (six) hours as needed for nausea.   20 tablet   0     BP 127/82  Pulse 67  Temp(Src) 98.3 F (36.8 C) (Oral)  Resp 16  SpO2 100%  LMP 02/24/2013  Physical Exam  Nursing note and vitals reviewed. Constitutional: She is oriented to person, place, and time. Vital signs are normal. She appears well-developed and well-nourished. She does not appear ill. No distress.  HENT:  Head: Normocephalic and atraumatic.  Right Ear: External ear normal.  Left Ear: External ear normal.  Nose: Nose normal.  Mouth/Throat: Oropharynx is clear and moist.  Eyes: Conjunctivae are normal.  Neck: Normal range of motion.  Cardiovascular: Normal rate, regular rhythm and normal heart sounds.   Pulmonary/Chest: Effort normal and breath sounds normal. No stridor. No respiratory distress. She has no wheezes. She has no rales.  Abdominal: Soft. She exhibits no distension.  Genitourinary: There is no rash or tenderness on the right labia. There is no rash or tenderness on the left labia. Right adnexum displays no mass and no tenderness. Left adnexum displays no mass and no tenderness. No erythema or tenderness around the vagina. No vaginal discharge found.  Musculoskeletal: Normal range of motion.  Neurological: She is alert and oriented to person, place, and time. She has normal strength.  Skin: Skin is warm and dry. She is not diaphoretic. No erythema.  Psychiatric:  She has a normal mood and affect. Her behavior is normal.    ED Course  Procedures (including critical care time)  Labs Reviewed  WET PREP, GENITAL - Abnormal; Notable for the following:    Clue Cells Wet Prep HPF POC FEW (*)    WBC, Wet Prep HPF POC FEW (*)    All other components within normal limits  CBC WITH DIFFERENTIAL - Abnormal; Notable for the following:    Neutrophils Relative 42 (*)    Lymphocytes Relative 50 (*)    All other components within normal limits   URINALYSIS, ROUTINE W REFLEX MICROSCOPIC - Abnormal; Notable for the following:    APPearance CLOUDY (*)    All other components within normal limits  GC/CHLAMYDIA PROBE AMP  POCT PREGNANCY, URINE  POCT I-STAT, CHEM 8   No results found.   1. Emesis   2. Dyspareunia       MDM  Patient presents with nausea and vomiting without abdominal pain for the past 3 weeks. No signs of dehydration. No concern for cholecystitis, appendicitis, pancreatitis. Her story is consistent with a possible gerd. Discussed antacids and lifestyle modification. Patient presents with dyspareunia and foul smelling discharge after having unprotected sexual intercourse with her partner. She was empirically treated with azithromycin and rocephin. Resource guide given so she can establish care with PCP. Return precautions given. Vital signs stable for discharge. Patient / Family / Caregiver informed of clinical course, understand medical decision-making process, and agree with plan.         Mora Bellman, PA-C 03/04/13 1042

## 2013-03-03 NOTE — ED Notes (Signed)
Pt given discharge paperwork.  No additional questions by pt regarding d/c.

## 2013-03-03 NOTE — ED Notes (Addendum)
Patient with history of three weeks of vomiting, on and off.  Patient states that smells make her nauseated.  Patient states she has been having some diarrhea with it.  Patient has been in and out of the bathroom here in ED with vomiting. Patient states she has an odor during her menses.

## 2013-03-07 NOTE — ED Provider Notes (Signed)
Medical screening examination/treatment/procedure(s) were performed by non-physician practitioner and as supervising physician I was immediately available for consultation/collaboration.    Gilda Crease, MD 03/07/13 [phone]

## 2014-09-28 ENCOUNTER — Emergency Department (HOSPITAL_COMMUNITY)
Admission: EM | Admit: 2014-09-28 | Discharge: 2014-09-28 | Discharge status: Against Medical Advice | Payer: Self-pay | Attending: Emergency Medicine | Admitting: Emergency Medicine

## 2014-09-28 ENCOUNTER — Encounter (HOSPITAL_COMMUNITY): Payer: Self-pay | Admitting: Emergency Medicine

## 2014-09-28 DIAGNOSIS — Z720 Tobacco use: Secondary | ICD-10-CM | POA: Insufficient documentation

## 2014-09-28 DIAGNOSIS — G43909 Migraine, unspecified, not intractable, without status migrainosus: Secondary | ICD-10-CM | POA: Insufficient documentation

## 2014-09-28 DIAGNOSIS — M79601 Pain in right arm: Secondary | ICD-10-CM | POA: Insufficient documentation

## 2014-09-28 NOTE — ED Notes (Signed)
Patient upset and cussing because she has not been seen. It was explained that the doctor has to see her first before medicine was given. Patient walked out cussing and said she has to go get her kids.

## 2014-09-28 NOTE — ED Notes (Signed)
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

## 2014-09-28 NOTE — ED Notes (Signed)
Patient was seen walking out of room, RN Laury DeepJeannene asked if she was okay. Went to the front entrance and then came back here. Patient stated "I've been here for F** hours and no one came and checked on me, Lavenia Atlasve been here for hours" Pt visibly angry. Came back and asked where room 19 was and went to room 19.

## 2014-09-29 ENCOUNTER — Emergency Department (INDEPENDENT_AMBULATORY_CARE_PROVIDER_SITE_OTHER)
Admission: EM | Admit: 2014-09-29 | Discharge: 2014-09-29 | Disposition: A | Discharge status: Home / Self Care | Payer: Self-pay | Source: Home / Self Care | Attending: Emergency Medicine | Admitting: Emergency Medicine

## 2014-09-29 ENCOUNTER — Encounter (HOSPITAL_COMMUNITY): Payer: Self-pay | Admitting: *Deleted

## 2014-09-29 DIAGNOSIS — B354 Tinea corporis: Secondary | ICD-10-CM

## 2014-09-29 MED ORDER — TERBINAFINE HCL 1 % EX CREA: 1.0000 "application " | TOPICAL_CREAM | Freq: Two times a day (BID) | CUTANEOUS | Status: DC

## 2014-09-29 NOTE — ED Provider Notes (Signed)
CSN: [id number]     Arrival date & time 09/29/14  1207 History   First MD Initiated Contact with Patient 09/29/14 1304     Chief Complaint  Patient presents with  . Recurrent Skin Infections   (Consider location/radiation/quality/duration/timing/severity/associated sxs/prior Treatment) HPI Comments: Patient presents with a skin lesion of left anterior thigh that has been present x 1 week. Has tried neosporin on lesion and this has not helped.  Reports herself to be otherwise healthy.  PCP: [facility]  The history is provided by the patient.    Past Medical History  Diagnosis Date  . Seasonal allergies    Past Surgical History  Procedure Laterality Date  . Tubal ligation     Family History  Problem Relation Age of Onset  . Cancer Other   . Diabetes Other   . Hypertension Other    History  Substance Use Topics  . Smoking status: Current Every Day Smoker    Types: Cigarettes  . Smokeless tobacco: Not on file  . Alcohol Use: Yes     Comment: now and again   OB History    No data available     Review of Systems  All other systems reviewed and are negative.   Allergies  Review of patient's allergies indicates no known allergies.  Home Medications   Prior to Admission medications   Medication Sig Start Date End Date Taking? Authorizing Provider  acetaminophen (TYLENOL) 500 MG tablet Take 1,000 mg by mouth every 6 (six) hours as needed for headache (headache).     Historical Provider, MD  cetirizine (ZYRTEC) 1 MG/ML syrup Take 10 mg by mouth daily as needed (allergies).    Historical Provider, MD  Cranberry-Vitamin C-Probiotic (AZO CRANBERRY PO) Take 2 capsules by mouth daily as needed (uti symptoms).    Historical Provider, MD  Multiple Vitamin (MULTIVITAMIN WITH MINERALS) TABS Take 1 tablet by mouth daily.    Historical Provider, MD  naproxen sodium (ANAPROX) 220 MG tablet Take 220 mg by mouth daily as needed (arm pain).    Historical Provider, MD   promethazine (PHENERGAN) 25 MG tablet Take 1 tablet (25 mg total) by mouth every 6 (six) hours as needed for nausea. 03/03/13   Mora BellmanHannah S Merrell, PA-C  terbinafine (LAMISIL AT) 1 % cream Apply 1 application topically 2 (two) times daily. X 14 days 09/29/14   Jess BartersJennifer Lee H Presson, PA   BP 132/88 mmHg  Pulse 57  Temp(Src) 98.9 F (37.2 C) (Oral)  Resp 16  SpO2 100%  LMP 08/31/2014 (Approximate) Physical Exam  Constitutional: She is oriented to person, place, and time. She appears well-developed and well-nourished. No distress.  HENT:  Head: Normocephalic and atraumatic.  Cardiovascular: Normal rate.   Pulmonary/Chest: Effort normal.  Musculoskeletal: Normal range of motion.  Neurological: She is alert and oriented to person, place, and time.  Skin: Skin is warm and dry.  1.5 cm annular erythematous lesion with raised border at left anterior mid thigh.   Nursing note and vitals reviewed.   ED Course  Procedures (including critical care time) Labs Review Labs Reviewed - No data to display  Imaging Review No results found.   MDM   1. Ringworm of body    Lamisil cream as directed PCP follow up if no improvement.     Ria ClockJennifer Lee H Presson, GeorgiaPA 09/29/14 1328

## 2014-09-29 NOTE — Discharge Instructions (Signed)

## 2014-09-29 NOTE — ED Notes (Signed)
Pt  Reports   She  Red    Peeling  Area  To l thigh        Applied neosporin    To  The  Affected  Area  yest  And  She  States  Subsequently    deveoloped some tingling of  Her  Tongue        And vomited  This was  Yesterday         Pt   Reports  The  Tingling  And  Vomiting  Have  Subsided       [facility]  [facility]

## 2014-11-21 ENCOUNTER — Encounter (HOSPITAL_COMMUNITY): Payer: Self-pay | Admitting: *Deleted

## 2014-11-21 ENCOUNTER — Other Ambulatory Visit (HOSPITAL_COMMUNITY)
Admission: RE | Admit: 2014-11-21 | Discharge: 2014-11-21 | Disposition: A | Discharge status: Home / Self Care | Payer: Self-pay | Source: Ambulatory Visit | Attending: Emergency Medicine | Admitting: Emergency Medicine

## 2014-11-21 ENCOUNTER — Emergency Department (INDEPENDENT_AMBULATORY_CARE_PROVIDER_SITE_OTHER)
Admission: EM | Admit: 2014-11-21 | Discharge: 2014-11-21 | Disposition: A | Discharge status: Home / Self Care | Payer: Self-pay | Source: Home / Self Care | Attending: Emergency Medicine | Admitting: Emergency Medicine

## 2014-11-21 DIAGNOSIS — Z113 Encounter for screening for infections with a predominantly sexual mode of transmission: Secondary | ICD-10-CM | POA: Insufficient documentation

## 2014-11-21 DIAGNOSIS — N760 Acute vaginitis: Secondary | ICD-10-CM | POA: Insufficient documentation

## 2014-11-21 DIAGNOSIS — B9689 Other specified bacterial agents as the cause of diseases classified elsewhere: Secondary | ICD-10-CM

## 2014-11-21 DIAGNOSIS — N898 Other specified noninflammatory disorders of vagina: Secondary | ICD-10-CM

## 2014-11-21 DIAGNOSIS — A499 Bacterial infection, unspecified: Secondary | ICD-10-CM

## 2014-11-21 LAB — POCT URINALYSIS DIP (DEVICE)
Bilirubin Urine: NEGATIVE
Glucose, UA: NEGATIVE mg/dL
Hgb urine dipstick: NEGATIVE
Leukocytes, UA: NEGATIVE
NITRITE: NEGATIVE
PH: 6 (ref 5.0–8.0)
Protein, ur: NEGATIVE mg/dL
SPECIFIC GRAVITY, URINE: 1.025 (ref 1.005–1.030)
UROBILINOGEN UA: 1 mg/dL (ref 0.0–1.0)

## 2014-11-21 LAB — POCT PREGNANCY, URINE: PREG TEST UR: NEGATIVE

## 2014-11-21 MED ORDER — METRONIDAZOLE 500 MG PO TABS: 500.0000 mg | ORAL_TABLET | Freq: Two times a day (BID) | ORAL | Status: DC

## 2014-11-21 NOTE — ED Notes (Signed)
Pt  Reports  Symptoms  Of      Vaginal  Discharge  /  Irritation          X  1  Week  - pt  Appears  In      No      Distress

## 2014-11-21 NOTE — ED Provider Notes (Signed)
CSN: [id number]     Arrival date & time 11/21/14  1706 History   First MD Initiated Contact with Patient 11/21/14 1811     Chief Complaint  Patient presents with  . Vaginal Discharge   (Consider location/radiation/quality/duration/timing/severity/associated sxs/prior Treatment) HPI  Past Medical History  Diagnosis Date  . Seasonal allergies    Past Surgical History  Procedure Laterality Date  . Tubal ligation     Family History  Problem Relation Age of Onset  . Cancer Other   . Diabetes Other   . Hypertension Other    History  Substance Use Topics  . Smoking status: Current Every Day Smoker    Types: Cigarettes  . Smokeless tobacco: Not on file  . Alcohol Use: Yes     Comment: now and again   OB History    No data available     Review of Systems  Allergies  Review of patient's allergies indicates no known allergies.  Home Medications   Prior to Admission medications   Medication Sig Start Date End Date Taking? Authorizing Provider  acetaminophen (TYLENOL) 500 MG tablet Take 1,000 mg by mouth every 6 (six) hours as needed for headache (headache).     Historical Provider, MD  cetirizine (ZYRTEC) 1 MG/ML syrup Take 10 mg by mouth daily as needed (allergies).    Historical Provider, MD  Cranberry-Vitamin C-Probiotic (AZO CRANBERRY PO) Take 2 capsules by mouth daily as needed (uti symptoms).    Historical Provider, MD  metroNIDAZOLE (FLAGYL) 500 MG tablet Take 1 tablet (500 mg total) by mouth 2 (two) times daily. X 7 days 11/21/14   Hayden Rasmussen, NP  Multiple Vitamin (MULTIVITAMIN WITH MINERALS) TABS Take 1 tablet by mouth daily.    Historical Provider, MD  naproxen sodium (ANAPROX) 220 MG tablet Take 220 mg by mouth daily as needed (arm pain).    Historical Provider, MD  promethazine (PHENERGAN) 25 MG tablet Take 1 tablet (25 mg total) by mouth every 6 (six) hours as needed for nausea. 03/03/13   Mora Bellman, PA-C  terbinafine (LAMISIL AT) 1 % cream Apply 1 application  topically 2 (two) times daily. X 14 days 09/29/14   Jess Barters H Presson, PA   BP 132/79 mmHg  Pulse 66  Temp(Src) 98.9 F (37.2 C) (Oral)  Resp 16  SpO2 100%  LMP 10/30/2014 Physical Exam  ED Course  Procedures (including critical care time) Labs Review Labs Reviewed  POCT URINALYSIS DIP (DEVICE) - Abnormal; Notable for the following:    Ketones, ur TRACE (*)    All other components within normal limits  POCT PREGNANCY, URINE  CERVICOVAGINAL ANCILLARY ONLY   Results for orders placed or performed during the hospital encounter of 11/21/14  POCT urinalysis dip (device)  Result Value Ref Range   Glucose, UA NEGATIVE NEGATIVE mg/dL   Bilirubin Urine NEGATIVE NEGATIVE   Ketones, ur TRACE (A) NEGATIVE mg/dL   Specific Gravity, Urine 1.025 1.005 - 1.030   Hgb urine dipstick NEGATIVE NEGATIVE   pH 6.0 5.0 - 8.0   Protein, ur NEGATIVE NEGATIVE mg/dL   Urobilinogen, UA 1.0 0.0 - 1.0 mg/dL   Nitrite NEGATIVE NEGATIVE   Leukocytes, UA NEGATIVE NEGATIVE  Pregnancy, urine POC  Result Value Ref Range   Preg Test, Ur NEGATIVE NEGATIVE    Imaging Review No results found.   MDM   1. Vaginal discharge   2. Vaginitis   3. BV (bacterial vaginosis)     Flagyl Cervical cytology pending  Hayden Rasmussenavid Deborah Lazcano, NP 11/21/14 1827

## 2014-11-21 NOTE — Discharge Instructions (Signed)
Bacterial Vaginosis °Bacterial vaginosis is a vaginal infection that occurs when the normal balance of bacteria in the vagina is disrupted. It results from an overgrowth of certain bacteria. This is the most common vaginal infection in women of childbearing age. Treatment is important to prevent complications, especially in pregnant women, as it can cause a premature delivery. °CAUSES  °Bacterial vaginosis is caused by an increase in harmful bacteria that are normally present in smaller amounts in the vagina. Several different kinds of bacteria can cause bacterial vaginosis. However, the reason that the condition develops is not fully understood. °RISK FACTORS °Certain activities or behaviors can put you at an increased risk of developing bacterial vaginosis, including: °· Having a new sex partner or multiple sex partners. °· Douching. °· Using an intrauterine device (IUD) for contraception. °Women do not get bacterial vaginosis from toilet seats, bedding, swimming pools, or contact with objects around them. °SIGNS AND SYMPTOMS  °Some women with bacterial vaginosis have no signs or symptoms. Common symptoms include: °· Grey vaginal discharge. °· A fishlike odor with discharge, especially after sexual intercourse. °· Itching or burning of the vagina and vulva. °· Burning or pain with urination. °DIAGNOSIS  °Your health care provider will take a medical history and examine the vagina for signs of bacterial vaginosis. A sample of vaginal fluid may be taken. Your health care provider will look at this sample under a microscope to check for bacteria and abnormal cells. A vaginal pH test may also be done.  °TREATMENT  °Bacterial vaginosis may be treated with antibiotic medicines. These may be given in the form of a pill or a vaginal cream. A second round of antibiotics may be prescribed if the condition comes back after treatment.  °HOME CARE INSTRUCTIONS  °· Only take over-the-counter or prescription medicines as  directed by your health care provider. °· If antibiotic medicine was prescribed, take it as directed. Make sure you finish it even if you start to feel better. °· Do not have sex until treatment is completed. °· Tell all sexual partners that you have a vaginal infection. They should see their health care provider and be treated if they have problems, such as a mild rash or itching. °· Practice safe sex by using condoms and only having one sex partner. °SEEK MEDICAL CARE IF:  °· Your symptoms are not improving after 3 days of treatment. °· You have increased discharge or pain. °· You have a fever. °MAKE SURE YOU:  °· Understand these instructions. °· Will watch your condition. °· Will get help right away if you are not doing well or get worse. °FOR MORE INFORMATION  °Centers for Disease Control and Prevention, Division of STD Prevention: www.cdc.gov/std °American Sexual Health Association (ASHA): www.ashastd.org  °Document Released: 10/30/2005 Document Revised: 08/20/2013 Document Reviewed: 06/11/2013 °ExitCare® Patient Information ©2015 [employer]. This information is not intended to replace advice given to you by your health care provider. Make sure you discuss any questions you have with your health care provider. ° °Vaginitis °Vaginitis is an inflammation of the vagina. It is most often caused by a change in the normal balance of the bacteria and yeast that live in the vagina. This change in balance causes an overgrowth of certain bacteria or yeast, which causes the inflammation. There are different types of vaginitis, but the most common types are: °· Bacterial vaginosis. °· Yeast infection (candidiasis). °· Trichomoniasis vaginitis. This is a sexually transmitted infection (STI). °· Viral vaginitis. °· Atropic vaginitis. °· Allergic vaginitis. °  CAUSES  °The cause depends on the type of vaginitis. Vaginitis can be caused by: °· Bacteria (bacterial vaginosis). °· Yeast (yeast infection). °· A parasite  (trichomoniasis vaginitis) °· A virus (viral vaginitis). °· Low hormone levels (atrophic vaginitis). Low hormone levels can occur during pregnancy, breastfeeding, or after menopause. °· Irritants, such as bubble baths, scented tampons, and feminine sprays (allergic vaginitis). °Other factors can change the normal balance of the yeast and bacteria that live in the vagina. These include: °· Antibiotic medicines. °· Poor hygiene. °· Diaphragms, vaginal sponges, spermicides, birth control pills, and intrauterine devices (IUD). °· Sexual intercourse. °· Infection. °· Uncontrolled diabetes. °· A weakened immune system. °SYMPTOMS  °Symptoms can vary depending on the cause of the vaginitis. Common symptoms include: °· Abnormal vaginal discharge. °¨ The discharge is white, gray, or yellow with bacterial vaginosis. °¨ The discharge is thick, white, and cheesy with a yeast infection. °¨ The discharge is frothy and yellow or greenish with trichomoniasis. °· A bad vaginal odor. °¨ The odor is fishy with bacterial vaginosis. °· Vaginal itching, pain, or swelling. °· Painful intercourse. °· Pain or burning when urinating. °Sometimes, there are no symptoms. °TREATMENT  °Treatment will vary depending on the type of infection.  °· Bacterial vaginosis and trichomoniasis are often treated with antibiotic creams or pills. °· Yeast infections are often treated with antifungal medicines, such as vaginal creams or suppositories. °· Viral vaginitis has no cure, but symptoms can be treated with medicines that relieve discomfort. Your sexual partner should be treated as well. °· Atrophic vaginitis may be treated with an estrogen cream, pill, suppository, or vaginal ring. If vaginal dryness occurs, lubricants and moisturizing creams may help. You may be told to avoid scented soaps, sprays, or douches. °· Allergic vaginitis treatment involves quitting the use of the product that is causing the problem. Vaginal creams can be used to treat the  symptoms. °HOME CARE INSTRUCTIONS  °· Take all medicines as directed by your caregiver. °· Keep your genital area clean and dry. Avoid soap and only rinse the area with water. °· Avoid douching. It can remove the healthy bacteria in the vagina. °· Do not use tampons or have sexual intercourse until your vaginitis has been treated. Use sanitary pads while you have vaginitis. °· Wipe from front to back. This avoids the spread of bacteria from the rectum to the vagina. °· Let air reach your genital area. °¨ Wear cotton underwear to decrease moisture buildup. °¨ Avoid wearing underwear while you sleep until your vaginitis is gone. °¨ Avoid tight pants and underwear or nylons without a cotton panel. °¨ Take off wet clothing (especially bathing suits) as soon as possible. °· Use mild, non-scented products. Avoid using irritants, such as: °¨ Scented feminine sprays. °¨ Fabric softeners. °¨ Scented detergents. °¨ Scented tampons. °¨ Scented soaps or bubble baths. °· Practice safe sex and use condoms. Condoms may prevent the spread of trichomoniasis and viral vaginitis. °SEEK MEDICAL CARE IF:  °· You have abdominal pain. °· You have a fever or persistent symptoms for more than 2-3 days. °· You have a fever and your symptoms suddenly get worse. °Document Released: 08/27/2007 Document Revised: 07/24/2012 Document Reviewed: 04/11/2012 °ExitCare® Patient Information ©2015 [employer]. This information is not intended to replace advice given to you by your health care provider. Make sure you discuss any questions you have with your health care provider. ° °

## 2014-11-23 LAB — CERVICOVAGINAL ANCILLARY ONLY
CHLAMYDIA, DNA PROBE: NEGATIVE
Neisseria Gonorrhea: NEGATIVE

## 2014-11-24 LAB — CERVICOVAGINAL ANCILLARY ONLY
WET PREP (BD AFFIRM): NEGATIVE
WET PREP (BD AFFIRM): NEGATIVE
Wet Prep (BD Affirm): NEGATIVE

## 2016-02-22 ENCOUNTER — Encounter (HOSPITAL_COMMUNITY): Payer: Self-pay | Admitting: Emergency Medicine

## 2016-02-22 ENCOUNTER — Ambulatory Visit (HOSPITAL_COMMUNITY)
Admission: EM | Admit: 2016-02-22 | Discharge: 2016-02-22 | Disposition: A | Discharge status: Home / Self Care | Payer: Self-pay | Attending: Family Medicine | Admitting: Family Medicine

## 2016-02-22 DIAGNOSIS — N898 Other specified noninflammatory disorders of vagina: Secondary | ICD-10-CM | POA: Insufficient documentation

## 2016-02-22 DIAGNOSIS — Z79899 Other long term (current) drug therapy: Secondary | ICD-10-CM | POA: Insufficient documentation

## 2016-02-22 DIAGNOSIS — B9689 Other specified bacterial agents as the cause of diseases classified elsewhere: Secondary | ICD-10-CM

## 2016-02-22 DIAGNOSIS — N760 Acute vaginitis: Secondary | ICD-10-CM | POA: Insufficient documentation

## 2016-02-22 DIAGNOSIS — A499 Bacterial infection, unspecified: Secondary | ICD-10-CM

## 2016-02-22 DIAGNOSIS — F17210 Nicotine dependence, cigarettes, uncomplicated: Secondary | ICD-10-CM | POA: Insufficient documentation

## 2016-02-22 LAB — POCT URINALYSIS DIP (DEVICE)
Bilirubin Urine: NEGATIVE
Glucose, UA: NEGATIVE mg/dL
HGB URINE DIPSTICK: NEGATIVE
Ketones, ur: NEGATIVE mg/dL
Leukocytes, UA: NEGATIVE
NITRITE: NEGATIVE
PH: 7 (ref 5.0–8.0)
PROTEIN: NEGATIVE mg/dL
SPECIFIC GRAVITY, URINE: 1.02 (ref 1.005–1.030)
UROBILINOGEN UA: 0.2 mg/dL (ref 0.0–1.0)

## 2016-02-22 MED ORDER — METRONIDAZOLE 500 MG PO TABS: 500.0000 mg | ORAL_TABLET | Freq: Two times a day (BID) | ORAL | Status: DC

## 2016-02-22 MED ORDER — FLUCONAZOLE 150 MG PO TABS: 150.0000 mg | ORAL_TABLET | Freq: Every day | ORAL | Status: DC

## 2016-02-22 NOTE — ED Provider Notes (Signed)
CSN: [id number]     Arrival date & time 02/22/16  1300 History   First MD Initiated Contact with Patient 02/22/16 1315     Chief Complaint  Patient presents with  . Vaginal Discharge   (Consider location/radiation/quality/duration/timing/severity/associated sxs/prior Treatment) Patient is a 30 y.o. female presenting with vaginal discharge. The history is provided by the patient. No language interpreter was used.  Vaginal Discharge Quality:  White Severity:  Moderate Onset quality:  Gradual Timing:  Constant Progression:  Worsening Chronicity:  New Context: after intercourse   Relieved by:  Nothing Worsened by:  Nothing tried Ineffective treatments:  None tried Associated symptoms: no urinary hesitancy and no vomiting   Risk factors: no STI and no STI exposure     Past Medical History  Diagnosis Date  . Seasonal allergies    Past Surgical History  Procedure Laterality Date  . Tubal ligation     Family History  Problem Relation Age of Onset  . Cancer Other   . Diabetes Other   . Hypertension Other    Social History  Substance Use Topics  . Smoking status: Current Every Day Smoker -- 1.50 packs/day    Types: Cigarettes  . Smokeless tobacco: None  . Alcohol Use: Yes     Comment: now and again   OB History    No data available     Review of Systems  Gastrointestinal: Negative for vomiting.  Genitourinary: Positive for vaginal discharge. Negative for hesitancy.  All other systems reviewed and are negative.   Allergies  Review of patient's allergies indicates no known allergies.  Home Medications   Prior to Admission medications   Medication Sig Start Date End Date Taking? Authorizing Provider  cetirizine (ZYRTEC) 1 MG/ML syrup Take 10 mg by mouth daily as needed (allergies).   Yes Historical Provider, MD  Multiple Vitamin (MULTIVITAMIN WITH MINERALS) TABS Take 1 tablet by mouth daily.   Yes Historical Provider, MD  acetaminophen (TYLENOL) 500 MG tablet Take  1,000 mg by mouth every 6 (six) hours as needed for headache (headache).     Historical Provider, MD  Cranberry-Vitamin C-Probiotic (AZO CRANBERRY PO) Take 2 capsules by mouth daily as needed (uti symptoms).    Historical Provider, MD  metroNIDAZOLE (FLAGYL) 500 MG tablet Take 1 tablet (500 mg total) by mouth 2 (two) times daily. X 7 days 11/21/14   Hayden Rasmussen, NP  naproxen sodium (ANAPROX) 220 MG tablet Take 220 mg by mouth daily as needed (arm pain).    Historical Provider, MD  promethazine (PHENERGAN) 25 MG tablet Take 1 tablet (25 mg total) by mouth every 6 (six) hours as needed for nausea. 03/03/13   Junious Silk, PA-C  terbinafine (LAMISIL AT) 1 % cream Apply 1 application topically 2 (two) times daily. X 14 days 09/29/14   Ria Clock, PA   Meds Ordered and Administered this Visit  Medications - No data to display  BP 121/73 mmHg  Pulse 60  Temp(Src) 98.4 F (36.9 C) (Oral)  Resp 16  SpO2 100%  LMP 01/28/2016 No data found.   Physical Exam  Constitutional: She is oriented to person, place, and time. She appears well-developed and well-nourished.  HENT:  Head: Normocephalic and atraumatic.  Right Ear: External ear normal.  Left Ear: External ear normal.  Mouth/Throat: Oropharynx is clear and moist.  Eyes: EOM are normal.  Neck: Normal range of motion.  Cardiovascular: Normal rate and normal heart sounds.   Pulmonary/Chest: Effort normal.  Abdominal: She  exhibits no distension.  Genitourinary: Uterus normal. Vaginal discharge found.  Adnexa nontender  Musculoskeletal: Normal range of motion.  Neurological: She is alert and oriented to person, place, and time.  Skin: Skin is warm.  Psychiatric: She has a normal mood and affect.  Nursing note and vitals reviewed.   ED Course  Procedures (including critical care time)  Labs Review Labs Reviewed  POCT URINALYSIS DIP (DEVICE)  CERVICOVAGINAL ANCILLARY ONLY    Imaging Review No results found.   Visual  Acuity Review  Right Eye Distance:   Left Eye Distance:   Bilateral Distance:    Right Eye Near:   Left Eye Near:    Bilateral Near:         MDM   1. BV (bacterial vaginosis)    Meds ordered this encounter  Medications  . metroNIDAZOLE (FLAGYL) 500 MG tablet    Sig: Take 1 tablet (500 mg total) by mouth 2 (two) times daily. X 7 days    Dispense:  14 tablet    Refill:  0    Order Specific Question:  Supervising Provider    Answer:  Linna HoffKINDL, JAMES D [phone][[id number]]  . fluconazole (DIFLUCAN) 150 MG tablet    Sig: Take 1 tablet (150 mg total) by mouth daily.    Dispense:  1 tablet    Refill:  1    Order Specific Question:  Supervising Provider    Answer:  Linna HoffKINDL, JAMES D [phone][[id number]]  An After Visit Summary was printed and given to the patient.    Lonia SkinnerLeslie K RexSofia, PA-C 02/22/16 [id number]

## 2016-02-22 NOTE — ED Notes (Signed)
PT reports intermittent abdominal cramping for 3 weeks. PT reports thick, white vaginal discharge after intercourse 2 days ago. PT reports vaginal burning after intercourse as well. PT states, "I don't know if I have an STD or what." PT reports a few days of vomiting several weeks ago. PT reports her stomach feels "hard" sometimes. PT had a tubal nine years ago.

## 2016-02-22 NOTE — Discharge Instructions (Signed)

## 2016-02-23 LAB — CERVICOVAGINAL ANCILLARY ONLY
Chlamydia: NEGATIVE
Neisseria Gonorrhea: NEGATIVE
WET PREP (BD AFFIRM): POSITIVE — AB

## 2016-03-31 ENCOUNTER — Emergency Department (HOSPITAL_COMMUNITY)
Admission: EM | Admit: 2016-03-31 | Discharge: 2016-03-31 | Disposition: A | Discharge status: Home / Self Care | Payer: Self-pay | Attending: Emergency Medicine | Admitting: Emergency Medicine

## 2016-03-31 ENCOUNTER — Encounter (HOSPITAL_COMMUNITY): Payer: Self-pay | Admitting: Emergency Medicine

## 2016-03-31 DIAGNOSIS — J069 Acute upper respiratory infection, unspecified: Secondary | ICD-10-CM

## 2016-03-31 DIAGNOSIS — J351 Hypertrophy of tonsils: Secondary | ICD-10-CM | POA: Insufficient documentation

## 2016-03-31 DIAGNOSIS — F17200 Nicotine dependence, unspecified, uncomplicated: Secondary | ICD-10-CM

## 2016-03-31 DIAGNOSIS — Z79899 Other long term (current) drug therapy: Secondary | ICD-10-CM | POA: Insufficient documentation

## 2016-03-31 DIAGNOSIS — F17210 Nicotine dependence, cigarettes, uncomplicated: Secondary | ICD-10-CM | POA: Insufficient documentation

## 2016-03-31 MED ORDER — BENZONATATE 100 MG PO CAPS: 100.0000 mg | ORAL_CAPSULE | Freq: Every evening | ORAL | Status: DC | PRN

## 2016-03-31 MED ORDER — GUAIFENESIN 100 MG/5ML PO LIQD: 100.0000 mg | ORAL | Status: DC | PRN

## 2016-03-31 MED ORDER — ALBUTEROL SULFATE HFA 108 (90 BASE) MCG/ACT IN AERS
2.0000 | INHALATION_SPRAY | Freq: Once | RESPIRATORY_TRACT | Status: AC
  Administered 2016-03-31: 2 via RESPIRATORY_TRACT
  Filled 2016-03-31: qty 6.7

## 2016-03-31 NOTE — ED Notes (Signed)
C/o nasal congestion and cough x 2 days. Green sputum. C/o CP with cough.

## 2016-03-31 NOTE — ED Provider Notes (Signed)
CSN: [id number]     Arrival date & time 03/31/16  1200 History   By signing my name below, I, Heather Farrell, attest that this documentation has been prepared under the direction and in the presence of Wittmann, PA-C. Electronically Signed: Evon Farrell, ED Scribe. 03/31/2016. 12:23 PM.    Chief Complaint  Patient presents with  . Cough   The history is provided by the patient. No language interpreter was used.   HPI Comments: Heather Farrell is a 30 y.o. female who presents to the Emergency Department complaining of productive cough onset 3 days prior. Pt reports associated nasal congestion, rhinorrhea, chills and improving sore throat. Pt states that last night felt she was having trouble breathing. She states that her breathing worsens when laying down and improves when sitting up. Pt states she has tried sudafed and Theraflu with no relief. She reports recently being around sick family members. Denies ear pain, trouble swallowing, vomiting, diarrhea or leg swelling.  No SOB during the day. Pt does report daily tobacco use.    Past Medical History  Diagnosis Date  . Seasonal allergies    Past Surgical History  Procedure Laterality Date  . Tubal ligation     Family History  Problem Relation Age of Onset  . Cancer Other   . Diabetes Other   . Hypertension Other    Social History  Substance Use Topics  . Smoking status: Current Every Day Smoker -- 1.50 packs/day    Types: Cigarettes  . Smokeless tobacco: None  . Alcohol Use: Yes     Comment: now and again   OB History    No data available      Review of Systems  Constitutional: Positive for chills. Negative for fever.  HENT: Positive for congestion, rhinorrhea and sore throat. Negative for ear pain and trouble swallowing.   Respiratory: Positive for cough and shortness of breath.   Cardiovascular: Negative for leg swelling.  Gastrointestinal: Negative for nausea, vomiting, abdominal pain and diarrhea.   Musculoskeletal: Negative for myalgias, neck pain and neck stiffness.  Skin: Negative for rash.  Allergic/Immunologic: Negative for immunocompromised state.  Psychiatric/Behavioral: Negative for self-injury.     Allergies  Review of patient's allergies indicates no known allergies.  Home Medications   Prior to Admission medications   Medication Sig Start Date End Date Taking? Authorizing Provider  acetaminophen (TYLENOL) 500 MG tablet Take 1,000 mg by mouth every 6 (six) hours as needed for headache (headache).     Historical Provider, MD  benzonatate (TESSALON) 100 MG capsule Take 1 capsule (100 mg total) by mouth at bedtime as needed for cough. 03/31/16   Trixie Dredge, PA-C  cetirizine (ZYRTEC) 1 MG/ML syrup Take 10 mg by mouth daily as needed (allergies).    Historical Provider, MD  Cranberry-Vitamin C-Probiotic (AZO CRANBERRY PO) Take 2 capsules by mouth daily as needed (uti symptoms).    Historical Provider, MD  fluconazole (DIFLUCAN) 150 MG tablet Take 1 tablet (150 mg total) by mouth daily. 02/22/16   Elson Areas, PA-C  guaiFENesin (ROBITUSSIN) 100 MG/5ML liquid Take 5-10 mLs (100-200 mg total) by mouth every 4 (four) hours as needed for cough. 03/31/16   Trixie Dredge, PA-C  metroNIDAZOLE (FLAGYL) 500 MG tablet Take 1 tablet (500 mg total) by mouth 2 (two) times daily. X 7 days 02/22/16   Elson Areas, PA-C  Multiple Vitamin (MULTIVITAMIN WITH MINERALS) TABS Take 1 tablet by mouth daily.    Historical Provider, MD  naproxen sodium (ANAPROX) 220 MG tablet Take 220 mg by mouth daily as needed (arm pain).    Historical Provider, MD  promethazine (PHENERGAN) 25 MG tablet Take 1 tablet (25 mg total) by mouth every 6 (six) hours as needed for nausea. 03/03/13   Junious SilkHannah Merrell, PA-C  terbinafine (LAMISIL AT) 1 % cream Apply 1 application topically 2 (two) times daily. X 14 days 09/29/14   Jess BartersJennifer Lee H Presson, PA   BP 130/70 mmHg  Pulse 72  Temp(Src) 98.6 F (37 C) (Oral)  Resp 20   SpO2 99%  LMP 03/20/2016   Physical Exam  Constitutional: She appears well-developed and well-nourished. No distress.  HENT:  Head: Normocephalic and atraumatic.  Mouth/Throat: No oropharyngeal exudate, posterior oropharyngeal edema or posterior oropharyngeal erythema.  symmetric hypertrophy of tonsils. Discharge in bilateral nares with mucosal edema. No focal sinus tenderness.   Eyes: Conjunctivae are normal.  Neck: Normal range of motion. Neck supple.  Cardiovascular: Normal rate and regular rhythm.   Pulmonary/Chest: Effort normal. No accessory muscle usage. No tachypnea. No respiratory distress. She has no decreased breath sounds. She has wheezes. She has no rhonchi. She has no rales.  Mild wheeze.   Neurological: She is alert. She exhibits normal muscle tone.  Skin: She is not diaphoretic.  Psychiatric: She has a normal mood and affect. Her behavior is normal.  Nursing note and vitals reviewed.   ED Course  Procedures (including critical care time) DIAGNOSTIC STUDIES: Oxygen Saturation is 99% on RA, normal by my interpretation.    COORDINATION OF CARE: 12:22 PM-Discussed treatment plan with pt at bedside and pt agreed to plan.     Labs Review Labs Reviewed - No data to display  Imaging Review No results found.     EKG Interpretation None      MDM   Final diagnoses:  URI (upper respiratory infection)  Smoker    Afebrile, nontoxic patient with constellation of symptoms suggestive of viral syndrome.  No concerning findings on exam with exception of very mild wheeze, likely explaining her symptoms.  Counseled on smoking cessation. O2 RA 100% Pt given albuterol hfa in ED.  Doubt PNA, PE.   Discharged home with supportive care, PCP follow up.  Discussed result, findings, treatment, and follow up  with patient.  Pt given return precautions.  Pt verbalizes understanding and agrees with plan.       I personally performed the services described in this documentation,  which was scribed in my presence. The recorded information has been reviewed and is accurate.    Trixie Dredgemily Lebert Lovern, PA-C 03/31/16 1246  Melene Planan Floyd, DO 03/31/16 [phone]

## 2016-03-31 NOTE — Discharge Instructions (Signed)
Read the information below.  Use the prescribed medication as directed.  Please discuss all new medications with your pharmacist.  You may return to the Emergency Department at any time for worsening condition or any new symptoms that concern you.   If you develop high fevers that do not resolve with tylenol or ibuprofen, you have difficulty swallowing or breathing, or you are unable to tolerate fluids by mouth, return to the ER for a recheck.     Viral Infections A viral infection can be caused by different types of viruses.Most viral infections are not serious and resolve on their own. However, some infections may cause severe symptoms and may lead to further complications. SYMPTOMS Viruses can frequently cause:  [place] sore throat.  Aches and pains.  Headaches.  Runny nose.  Different types of rashes.  Watery eyes.  Tiredness.  Cough.  Loss of appetite.  Gastrointestinal infections, resulting in nausea, vomiting, and diarrhea. These symptoms do not respond to antibiotics because the infection is not caused by bacteria. However, you might catch a bacterial infection following the viral infection. This is sometimes called a "superinfection." Symptoms of such a bacterial infection may include:  Worsening sore throat with pus and difficulty swallowing.  Swollen neck glands.  Chills and a high or persistent fever.  Severe headache.  Tenderness over the sinuses.  Persistent overall ill feeling (malaise), muscle aches, and tiredness (fatigue).  Persistent cough.  Yellow, green, or brown mucus production with coughing. HOME CARE INSTRUCTIONS   Only take over-the-counter or prescription medicines for pain, discomfort, diarrhea, or fever as directed by your caregiver.  Drink enough water and fluids to keep your urine clear or pale yellow. Sports drinks can provide valuable electrolytes, sugars, and hydration.  Get plenty of rest and maintain proper nutrition. Soups and  broths with crackers or rice are fine. SEEK IMMEDIATE MEDICAL CARE IF:   You have severe headaches, shortness of breath, chest pain, neck pain, or an unusual rash.  You have uncontrolled vomiting, diarrhea, or you are unable to keep down fluids.  You or your child has an oral temperature above 102 F (38.9 C), not controlled by medicine.  Your baby is older than 3 months with a rectal temperature of 102 F (38.9 C) or higher.  Your baby is 273 months old or younger with a rectal temperature of 100.4 F (38 C) or higher. MAKE SURE YOU:   Understand these instructions.  Will watch your condition.  Will get help right away if you are not doing well or get worse.   This information is not intended to replace advice given to you by your health care provider. Make sure you discuss any questions you have with your health care provider.   Document Released: 08/09/2005 Document Revised: 01/22/2012 Document Reviewed: 04/07/2015 Elsevier Interactive Patient Education 2016 [employer]  Smoking Hazards Smoking cigarettes is extremely bad for your health. Tobacco smoke has over 200 known poisons in it. It contains the poisonous gases nitrogen oxide and carbon monoxide. There are over 60 chemicals in tobacco smoke that cause cancer. Some of the chemicals found in cigarette smoke include:   Cyanide.   Benzene.   Formaldehyde.   Methanol (wood alcohol).   Acetylene (fuel used in welding torches).   Ammonia.  Even smoking lightly shortens your life expectancy by several years. You can greatly reduce the risk of medical problems for you and your family by stopping now. Smoking is the most preventable cause of death and disease  in our society. Within days of quitting smoking, your circulation improves, you decrease the risk of having a heart attack, and your lung capacity improves. There may be some increased phlegm in the first few days after quitting, and it may take months for your  lungs to clear up completely. Quitting for 10 years reduces your risk of developing lung cancer to almost that of a nonsmoker.  WHAT ARE THE RISKS OF SMOKING? Cigarette smokers have an increased risk of many serious medical problems, including:  Lung cancer.   Lung disease (such as pneumonia, bronchitis, and emphysema).   Heart attack and chest pain due to the heart not getting enough oxygen (angina).   Heart disease and peripheral blood vessel disease.   Hypertension.   Stroke.   Oral cancer (cancer of the lip, mouth, or voice box).   Bladder cancer.   Pancreatic cancer.   Cervical cancer.   Pregnancy complications, including premature birth.   Stillbirths and smaller newborn babies, birth defects, and genetic damage to sperm.   Early menopause.   Lower estrogen level for women.   Infertility.   Facial wrinkles.   Blindness.   Increased risk of broken bones (fractures).   Senile dementia.   Stomach ulcers and internal bleeding.   Delayed wound healing and increased risk of complications during surgery. Because of secondhand smoke exposure, children of smokers have an increased risk of the following:   Sudden infant death syndrome (SIDS).   Respiratory infections.   Lung cancer.   Heart disease.   Ear infections.  WHY IS SMOKING ADDICTIVE? Nicotine is the chemical agent in tobacco that is capable of causing addiction or dependence. When you smoke and inhale, nicotine is absorbed rapidly into the bloodstream through your lungs. Both inhaled and noninhaled nicotine may be addictive.  WHAT ARE THE BENEFITS OF QUITTING?  There are many health benefits to quitting smoking. Some are:   The likelihood of developing cancer and heart disease decreases. Health improvements are seen almost immediately.   Blood pressure, pulse rate, and breathing patterns start returning to normal soon after quitting.   People who quit may see an  improvement in their overall quality of life.  HOW DO YOU QUIT SMOKING? Smoking is an addiction with both physical and psychological effects, and longtime habits can be hard to change. Your health care provider can recommend:  Programs and community resources, which may include group support, education, or therapy.  Replacement products, such as patches, gum, and nasal sprays. Use these products only as directed. Do not replace cigarette smoking with electronic cigarettes (commonly called e-cigarettes). The safety of e-cigarettes is unknown, and some may contain harmful chemicals. FOR MORE INFORMATION  American Lung Association: www.lung.org  American Cancer Society: www.cancer.org   This information is not intended to replace advice given to you by your health care provider. Make sure you discuss any questions you have with your health care provider.   Document Released: 12/07/2004 Document Revised: 08/20/2013 Document Reviewed: 04/21/2013 Elsevier Interactive Patient Education [employer]

## 2017-11-29 ENCOUNTER — Encounter (HOSPITAL_COMMUNITY): Payer: Self-pay | Admitting: Emergency Medicine

## 2017-11-29 DIAGNOSIS — Z79899 Other long term (current) drug therapy: Secondary | ICD-10-CM | POA: Insufficient documentation

## 2017-11-29 DIAGNOSIS — R8290 Unspecified abnormal findings in urine: Secondary | ICD-10-CM | POA: Insufficient documentation

## 2017-11-29 DIAGNOSIS — M25561 Pain in right knee: Secondary | ICD-10-CM | POA: Insufficient documentation

## 2017-11-29 DIAGNOSIS — F17210 Nicotine dependence, cigarettes, uncomplicated: Secondary | ICD-10-CM | POA: Insufficient documentation

## 2017-11-29 LAB — URINALYSIS, ROUTINE W REFLEX MICROSCOPIC
BILIRUBIN URINE: NEGATIVE
GLUCOSE, UA: NEGATIVE mg/dL
HGB URINE DIPSTICK: NEGATIVE
KETONES UR: 20 mg/dL — AB
LEUKOCYTES UA: NEGATIVE
Nitrite: NEGATIVE
PH: 6 (ref 5.0–8.0)
Protein, ur: NEGATIVE mg/dL
SPECIFIC GRAVITY, URINE: 1.029 (ref 1.005–1.030)

## 2017-11-29 NOTE — ED Triage Notes (Signed)
Patient reports chronic right knee pain and right shoulder joint pain , denies injury or fall,ambulatory , pt. added malodorous urine and mild dysuria .

## 2017-11-30 ENCOUNTER — Emergency Department (HOSPITAL_COMMUNITY)
Admission: EM | Admit: 2017-11-30 | Discharge: 2017-11-30 | Disposition: A | Discharge status: Home / Self Care | Payer: Self-pay | Attending: Emergency Medicine | Admitting: Emergency Medicine

## 2017-11-30 DIAGNOSIS — M25561 Pain in right knee: Secondary | ICD-10-CM

## 2017-11-30 DIAGNOSIS — R8290 Unspecified abnormal findings in urine: Secondary | ICD-10-CM

## 2017-11-30 NOTE — Discharge Instructions (Signed)
If you do not have a primary care physician then it is very important that you develop a relationship with one.  Please contact [facility] at [phone] for a referral to many excellent primary care physicians in the community.   ° ° °

## 2017-11-30 NOTE — ED Provider Notes (Signed)
[facility] EMERGENCY DEPARTMENT Provider Note  CSN: [id number] Arrival date & time: 11/29/17 2145  Chief Complaint(s) Malodorous Urine and Knee Pain  HPI Heather Farrell is a 32 y.o. female patient presents with several days of foul-smelling urine.  She denies any dysuria or pelvic discomfort.  States that this occurs while she is on her menstrual cycle which she is currently in.  Denies any vaginal discharge.  No alleviating or aggravating factors.  She denies any other associated symptoms.  Additionally patient reports several months of recurrent right knee pain which she notices most after a long day of work where she stands for most of the day.  She denies any recent trauma.  No associated swelling.  No erythema.  Improves with over-the-counter medicine.  Denies any other alleviating or aggravating factors.  Denies any other physical complaints.    HPI  Past Medical History Past Medical History:  Diagnosis Date  . Seasonal allergies    There are no active problems to display for this patient.  Home Medication(s) Prior to Admission medications   Medication Sig Start Date End Date Taking? Authorizing Provider  acetaminophen (TYLENOL) 500 MG tablet Take 1,000 mg by mouth every 6 (six) hours as needed for headache (headache).     [provider]  benzonatate (TESSALON) 100 MG capsule Take 1 capsule (100 mg total) by mouth at bedtime as needed for cough. 03/31/16   Trixie Dredge, PA-C  cetirizine (ZYRTEC) 1 MG/ML syrup Take 10 mg by mouth daily as needed (allergies).    [provider]  Cranberry-Vitamin C-Probiotic (AZO CRANBERRY PO) Take 2 capsules by mouth daily as needed (uti symptoms).    [provider]  fluconazole (DIFLUCAN) 150 MG tablet Take 1 tablet (150 mg total) by mouth daily. 02/22/16   Elson Areas, PA-C  guaiFENesin (ROBITUSSIN) 100 MG/5ML liquid Take 5-10 mLs (100-200 mg total) by mouth every 4 (four) hours as needed for  cough. 03/31/16   Trixie Dredge, PA-C  metroNIDAZOLE (FLAGYL) 500 MG tablet Take 1 tablet (500 mg total) by mouth 2 (two) times daily. X 7 days 02/22/16   Elson Areas, PA-C  Multiple Vitamin (MULTIVITAMIN WITH MINERALS) TABS Take 1 tablet by mouth daily.    [provider]  naproxen sodium (ANAPROX) 220 MG tablet Take 220 mg by mouth daily as needed (arm pain).    [provider]  promethazine (PHENERGAN) 25 MG tablet Take 1 tablet (25 mg total) by mouth every 6 (six) hours as needed for nausea. 03/03/13   Junious Silk, PA-C  terbinafine (LAMISIL AT) 1 % cream Apply 1 application topically 2 (two) times daily. X 14 days 09/29/14   Presson, [place], [place]                                                                                                                                    Past Surgical  History Past Surgical History:  Procedure Laterality Date  . TUBAL LIGATION     Family History Family History  Problem Relation Age of Onset  . Cancer Other   . Diabetes Other   . Hypertension Other     Social History Social History   Tobacco Use  . Smoking status: Current Every Day Smoker    Packs/day: 1.50    Types: Cigarettes  Substance Use Topics  . Alcohol use: Yes    Comment: now and again  . Drug use: Yes    Types: Marijuana   Allergies Patient has no known allergies.  Review of Systems Review of Systems All other systems are reviewed and are negative for acute change except as noted in the HPI  Physical Exam Vital Signs  I have reviewed the triage vital signs BP (!) 140/92 (BP Location: Right Arm)   Pulse 74   Temp 98.1 F (36.7 C) (Oral)   Resp 18   LMP 11/25/2017   SpO2 100%   Physical Exam  Constitutional: She is oriented to person, place, and time. She appears well-developed and well-nourished. No distress.  HENT:  Head: Normocephalic and atraumatic.  Right Ear: External ear normal.  Left Ear: External ear normal.  Nose: Nose  normal.  Eyes: Conjunctivae and EOM are normal. No scleral icterus.  Neck: Normal range of motion and phonation normal.  Cardiovascular: Normal rate and regular rhythm.  Pulmonary/Chest: Effort normal. No stridor. No respiratory distress.  Abdominal: She exhibits no distension.  Musculoskeletal: Normal range of motion. She exhibits no edema.       Right knee: She exhibits normal range of motion, no swelling, no deformity and no erythema. No tenderness found.  Neurological: She is alert and oriented to person, place, and time.  Skin: She is not diaphoretic.  Psychiatric: She has a normal mood and affect. Her behavior is normal.  Vitals reviewed.   ED Results and Treatments Labs (all labs ordered are listed, but only abnormal results are displayed) Labs Reviewed  URINALYSIS, ROUTINE W REFLEX MICROSCOPIC - Abnormal; Notable for the following components:      Result Value   Ketones, ur 20 (*)    All other components within normal limits                                                                                                                         EKG  EKG Interpretation  Date/Time:    Ventricular Rate:    PR Interval:    QRS Duration:   QT Interval:    QTC Calculation:   R Axis:     Text Interpretation:        Radiology No results found. Pertinent labs & imaging results that were available during my care of the patient were reviewed by me and considered in my medical decision making (see chart for details).  Medications Ordered in ED Medications - No data to display  Procedures Procedures  (including critical care time)  Medical Decision Making / ED Course I have reviewed the nursing notes for this encounter and the patient's prior records (if available in EHR or on provided paperwork).    1.  Foul-smelling urine UA without evidence  of infection.  2. Knee pain Appears to be associated with prolonged standing likely overuse.  No associated trauma.  No erythema or swelling.  Low suspicion for septic arthritis, DVT, fracture/dislocation.  No imaging required.  The patient is safe for discharge with strict return precautions.   Final Clinical Impression(s) / ED Diagnoses Final diagnoses:  Acute pain of right knee  Foul smelling urine    Disposition: Discharge  Condition: Good  I have discussed the results, Dx and Tx plan with the patient who expressed understanding and agree(s) with the plan. Discharge instructions discussed at great length. The patient was given strict return precautions who verbalized understanding of the instructions. No further questions at time of discharge.    ED Discharge Orders    None      This chart was dictated using voice recognition software.  Despite best efforts to proofread,  errors can occur which can change the documentation meaning.   Nira Connardama, Kalla Watson Eduardo, MD 11/30/17 0201

## 2018-02-02 ENCOUNTER — Encounter (HOSPITAL_COMMUNITY): Payer: Self-pay | Admitting: Family Medicine

## 2018-02-02 ENCOUNTER — Ambulatory Visit (HOSPITAL_COMMUNITY)
Admission: EM | Admit: 2018-02-02 | Discharge: 2018-02-02 | Disposition: A | Discharge status: Home / Self Care | Payer: Self-pay | Attending: Family Medicine | Admitting: Family Medicine

## 2018-02-02 DIAGNOSIS — M549 Dorsalgia, unspecified: Secondary | ICD-10-CM | POA: Insufficient documentation

## 2018-02-02 DIAGNOSIS — N760 Acute vaginitis: Secondary | ICD-10-CM

## 2018-02-02 DIAGNOSIS — Z888 Allergy status to other drugs, medicaments and biological substances status: Secondary | ICD-10-CM | POA: Insufficient documentation

## 2018-02-02 DIAGNOSIS — F17210 Nicotine dependence, cigarettes, uncomplicated: Secondary | ICD-10-CM | POA: Insufficient documentation

## 2018-02-02 DIAGNOSIS — Z809 Family history of malignant neoplasm, unspecified: Secondary | ICD-10-CM | POA: Insufficient documentation

## 2018-02-02 DIAGNOSIS — Z833 Family history of diabetes mellitus: Secondary | ICD-10-CM | POA: Insufficient documentation

## 2018-02-02 DIAGNOSIS — Z9851 Tubal ligation status: Secondary | ICD-10-CM | POA: Insufficient documentation

## 2018-02-02 DIAGNOSIS — Z79899 Other long term (current) drug therapy: Secondary | ICD-10-CM | POA: Insufficient documentation

## 2018-02-02 DIAGNOSIS — Z8249 Family history of ischemic heart disease and other diseases of the circulatory system: Secondary | ICD-10-CM | POA: Insufficient documentation

## 2018-02-02 LAB — POCT URINALYSIS DIP (DEVICE)
Bilirubin Urine: NEGATIVE
Glucose, UA: NEGATIVE mg/dL
Hgb urine dipstick: NEGATIVE
Ketones, ur: 40 mg/dL — AB
Leukocytes, UA: NEGATIVE
NITRITE: NEGATIVE
PH: 5.5 (ref 5.0–8.0)
PROTEIN: NEGATIVE mg/dL
Specific Gravity, Urine: >=1.03 (ref 1.005–1.030)
Urobilinogen, UA: 1 mg/dL (ref 0.0–1.0)

## 2018-02-02 LAB — POCT PREGNANCY, URINE: Preg Test, Ur: NEGATIVE

## 2018-02-02 MED ORDER — FLUCONAZOLE 200 MG PO TABS: ORAL_TABLET | ORAL | 0 refills | Status: DC

## 2018-02-02 MED ORDER — METRONIDAZOLE 500 MG PO TABS: 500.0000 mg | ORAL_TABLET | Freq: Two times a day (BID) | ORAL | 0 refills | Status: AC

## 2018-02-02 NOTE — Discharge Instructions (Signed)
Diflucan once today and once at completion of metronidazole. 7 days of metronidazole. Do not drink alcohol while taking. Please withhold from intercourse for the next week. Please use condoms to prevent STD's.   Will notify your of any positive findings and if any changes to treatment are needed.   If symptoms worsen or do not improve in the next week to return to be seen or to follow up with your PCP.

## 2018-02-02 NOTE — ED Triage Notes (Signed)
Pt here for vaginal itching, discharge x 2 weeks. Hx of BV, some lower abd cramping. Denies any urinary symptoms.

## 2018-02-02 NOTE — ED Provider Notes (Signed)
[facility]    CSN: [id number] Arrival date & time: 02/02/18  1752     History   Chief Complaint Chief Complaint  Patient presents with  . Vaginal Discharge    HPI Heather Farrell is a 32 y.o. female.   Kennon RoundsSally presents with complaints of vaginal itching and discharge which has been ongoing for the past two weeks. States she does feel discomfort with intercourse. Denies any current pain. Without abdominal pain. Occasional back pain. Denies vaginal bleeding. Denies sores, lesions or open skin or rash. No known fevers. States has had BV in the past and felt similar. Has had STD's in the past, no known exposure to STD but states her partner has cheated on her. Does not use condoms. Denies urinary symptoms. Takes only Align to help keep bm's regular, otherwise not taking any medications daily.   ROS per HPI.      Past Medical History:  Diagnosis Date  . Seasonal allergies     There are no active problems to display for this patient.   Past Surgical History:  Procedure Laterality Date  . TUBAL LIGATION      OB History   None      Home Medications    Prior to Admission medications   Medication Sig Start Date End Date Taking? Authorizing Provider  acetaminophen (TYLENOL) 500 MG tablet Take 1,000 mg by mouth every 6 (six) hours as needed for headache (headache).     [provider]  cetirizine (ZYRTEC) 1 MG/ML syrup Take 10 mg by mouth daily as needed (allergies).    [provider]  Cranberry-Vitamin C-Probiotic (AZO CRANBERRY PO) Take 2 capsules by mouth daily as needed (uti symptoms).    [provider]  fluconazole (DIFLUCAN) 200 MG tablet Take once today. Take second pill at completion of antibiotics. 02/02/18   Georgetta HaberBurky, Juaquina Machnik B, NP  metroNIDAZOLE (FLAGYL) 500 MG tablet Take 1 tablet (500 mg total) by mouth 2 (two) times daily for 7 days. 02/02/18 02/09/18  Georgetta HaberBurky, Abas Leicht B, NP  Multiple Vitamin (MULTIVITAMIN WITH MINERALS) TABS Take  1 tablet by mouth daily.    [provider]  naproxen sodium (ANAPROX) 220 MG tablet Take 220 mg by mouth daily as needed (arm pain).    [provider]    Family History Family History  Problem Relation Age of Onset  . Cancer Other   . Diabetes Other   . Hypertension Other     Social History Social History   Tobacco Use  . Smoking status: Current Every Day Smoker    Packs/day: 1.50    Types: Cigarettes  Substance Use Topics  . Alcohol use: Yes    Comment: now and again  . Drug use: Yes    Types: Marijuana     Allergies   Patient has no known allergies.   Review of Systems Review of Systems   Physical Exam Triage Vital Signs ED Triage Vitals  Enc Vitals Group     BP 02/02/18 1802 133/90     Pulse Rate 02/02/18 1802 80     Resp 02/02/18 1802 16     Temp 02/02/18 1802 98.7 F (37.1 C)     Temp Source 02/02/18 1802 Oral     SpO2 02/02/18 1802 100 %     Weight --      Height --      Head Circumference --      Peak Flow --      Pain Score  02/02/18 1805 0     Pain Loc --      Pain Edu? --      Excl. in GC? --    No data found.  Updated Vital Signs BP 133/90 (BP Location: Left Arm)   Pulse 80   Temp 98.7 F (37.1 C) (Oral)   Resp 16   LMP 01/23/2018   SpO2 100%   Visual Acuity Right Eye Distance:   Left Eye Distance:   Bilateral Distance:    Right Eye Near:   Left Eye Near:    Bilateral Near:     Physical Exam  Constitutional: She is oriented to person, place, and time. She appears well-developed and well-nourished. No distress.  Cardiovascular: Normal rate, regular rhythm and normal heart sounds.  Pulmonary/Chest: Effort normal and breath sounds normal.  Abdominal: Soft. There is no tenderness. There is no rigidity, no rebound, no guarding and no CVA tenderness.  Genitourinary:  Genitourinary Comments: Denies pain, bleeding, lesions or open sores; gu exam deferred at this time  Neurological: She is alert and oriented to  person, place, and time.  Skin: Skin is warm and dry.     UC Treatments / Results  Labs (all labs ordered are listed, but only abnormal results are displayed) Labs Reviewed  POCT URINALYSIS DIP (DEVICE) - Abnormal; Notable for the following components:      Result Value   Ketones, ur 40 (*)    All other components within normal limits  POCT PREGNANCY, URINE  URINE CYTOLOGY ANCILLARY ONLY    EKG None Radiology No results found.  Procedures Procedures (including critical care time)  Medications Ordered in UC Medications - No data to display   Initial Impression / Assessment and Plan / UC Course  I have reviewed the triage vital signs and the nursing notes.  Pertinent labs & imaging results that were available during my care of the patient were reviewed by me and considered in my medical decision making (see chart for details).     Urine cytology pending. Diflucan and flagyl initiated at this time. Will notify of any positive findings and if any changes to treatment are needed.  If symptoms worsen or do not improve in the next week to return to be seen or to follow up with PCP. Patient verbalized understanding and agreeable to plan.    Final Clinical Impressions(s) / UC Diagnoses   Final diagnoses:  Acute vaginitis    ED Discharge Orders        Ordered    fluconazole (DIFLUCAN) 200 MG tablet     02/02/18 1822    metroNIDAZOLE (FLAGYL) 500 MG tablet  2 times daily     02/02/18 1822       Controlled Substance Prescriptions [place] Controlled Substance Registry consulted? Not Applicable   Georgetta Haber, NP 02/02/18 [phone]

## 2018-02-04 LAB — URINE CYTOLOGY ANCILLARY ONLY
CHLAMYDIA, DNA PROBE: NEGATIVE
Neisseria Gonorrhea: NEGATIVE
TRICH (WINDOWPATH): NEGATIVE

## 2018-02-05 LAB — URINE CYTOLOGY ANCILLARY ONLY
Bacterial vaginitis: POSITIVE — AB
Candida vaginitis: NEGATIVE

## 2018-02-12 ENCOUNTER — Telehealth (HOSPITAL_COMMUNITY): Payer: Self-pay

## 2018-02-12 NOTE — Telephone Encounter (Signed)
Attempted to call patient regarding results from recent visit. No answer at this time. Message left encouraging pt to call us back.

## 2019-01-03 ENCOUNTER — Ambulatory Visit (HOSPITAL_COMMUNITY)
Admission: EM | Admit: 2019-01-03 | Discharge: 2019-01-03 | Disposition: A | Discharge status: Home / Self Care | Payer: Self-pay | Attending: Family Medicine | Admitting: Family Medicine

## 2019-01-03 ENCOUNTER — Encounter (HOSPITAL_COMMUNITY): Payer: Self-pay

## 2019-01-03 DIAGNOSIS — R21 Rash and other nonspecific skin eruption: Secondary | ICD-10-CM

## 2019-01-03 DIAGNOSIS — Z202 Contact with and (suspected) exposure to infections with a predominantly sexual mode of transmission: Secondary | ICD-10-CM | POA: Insufficient documentation

## 2019-01-03 DIAGNOSIS — B9689 Other specified bacterial agents as the cause of diseases classified elsewhere: Secondary | ICD-10-CM | POA: Insufficient documentation

## 2019-01-03 DIAGNOSIS — L240 Irritant contact dermatitis due to detergents: Secondary | ICD-10-CM | POA: Insufficient documentation

## 2019-01-03 DIAGNOSIS — N760 Acute vaginitis: Secondary | ICD-10-CM | POA: Insufficient documentation

## 2019-01-03 DIAGNOSIS — Z113 Encounter for screening for infections with a predominantly sexual mode of transmission: Secondary | ICD-10-CM

## 2019-01-03 MED ORDER — METRONIDAZOLE 500 MG PO TABS: 500.0000 mg | ORAL_TABLET | Freq: Two times a day (BID) | ORAL | 0 refills | Status: DC

## 2019-01-03 MED ORDER — FLUCONAZOLE 150 MG PO TABS: 150.0000 mg | ORAL_TABLET | Freq: Every day | ORAL | 0 refills | Status: DC

## 2019-01-03 MED ORDER — TRIAMCINOLONE ACETONIDE 0.1 % EX OINT: 1.0000 "application " | TOPICAL_OINTMENT | Freq: Two times a day (BID) | CUTANEOUS | 1 refills | Status: DC

## 2019-01-03 NOTE — Discharge Instructions (Addendum)
Use triamcinolone cream on your hands 2 times a day to clear up the rash Take Zyrtec 1 or 2 times a day for itching You must wear gloves when you are exposed to chemicals or wash dishes  Take metronidazole 2 times a day for 7 days Take Diflucan as directed We did lab testing during this visit.  If there are any abnormal findings that require change in medicine or indicate a positive result, you will be notified.  If all of your tests are normal, you will not be called.

## 2019-01-03 NOTE — ED Triage Notes (Signed)
Pt present a rash on both hands, pt states the rash is itching and very dry.  Pt states she is having vaginal discharge and its clear. Symptoms started two weeks ago.

## 2019-01-03 NOTE — ED Provider Notes (Signed)
[facility]    CSN: [id number] Arrival date & time: 01/03/19  1920     History   Chief Complaint Chief Complaint  Patient presents with  . Exposure to STD  . Rash    Hands    HPI Heather Farrell is a 33 y.o. female.   HPI  Heather Farrell has a couple things going on First, she has a rash on her hands.  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  She states the use of harsher chemicals and they do at home.  It makes her hands blister and crack.  I discussed with her that avoidance is going to be important moving forward.  She has a contact dermatitis.  This can be treated with steroid cream.  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  She states is been going on since she was 10.  She states she gets at least twice a year.  She has tried any number of soaps lotions powders and products, she tries to avoid any perfumes or lotions in her vaginal area.  She still gets recurring BV.  I gave her some advice on probiotics.  We will treat her with metronidazole.  Needs to follow-up with a GYN or PCP. Another problem is that her boyfriend has another girlfriend and she wants to be tested for STD.  She is asymptomatic  Past Medical History:  Diagnosis Date  . Seasonal allergies     There are no active problems to display for this patient.   Past Surgical History:  Procedure Laterality Date  . TUBAL LIGATION      OB History   No obstetric history on file.      Home Medications    Prior to Admission medications   Medication Sig Start Date End Date Taking? Authorizing Provider  acetaminophen (TYLENOL) 500 MG tablet Take 1,000 mg by mouth every 6 (six) hours as needed for headache (headache).     [provider]  fluconazole (DIFLUCAN) 150 MG tablet Take 1 tablet (150 mg total) by mouth daily. Repeat in 1 week if needed 01/03/19   Eustace Moore, MD  metroNIDAZOLE (FLAGYL) 500 MG tablet Take 1 tablet (500 mg total) by mouth  2 (two) times daily. 01/03/19   Eustace Moore, MD  Multiple Vitamin (MULTIVITAMIN WITH MINERALS) TABS Take 1 tablet by mouth daily.    [provider]  naproxen sodium (ANAPROX) 220 MG tablet Take 220 mg by mouth daily as needed (arm pain).    [provider]  triamcinolone ointment (KENALOG) 0.1 % Apply 1 application topically 2 (two) times daily. 01/03/19   Eustace Moore, MD    Family History Family History  Problem Relation Age of Onset  . Cancer Other   . Diabetes Other   . Hypertension Other     Social History Social History   Tobacco Use  . Smoking status: Current Every Day Smoker    Packs/day: 1.50    Types: Cigarettes  Substance Use Topics  . Alcohol use: Yes    Comment: now and again  . Drug use: Yes    Types: Marijuana     Allergies   Patient has no known allergies.   Review of Systems Review of Systems  Constitutional: Negative for chills and fever.  HENT: Negative for ear pain and sore throat.   Eyes: Negative for pain and visual disturbance.  Respiratory: Negative for cough and shortness of  breath.   Cardiovascular: Negative for chest pain and palpitations.  Gastrointestinal: Negative for abdominal pain and vomiting.  Genitourinary: Positive for vaginal discharge. Negative for dysuria and hematuria.  Musculoskeletal: Negative for arthralgias and back pain.  Skin: Positive for rash. Negative for color change.  Neurological: Negative for seizures and syncope.  All other systems reviewed and are negative.    Physical Exam Triage Vital Signs ED Triage Vitals  Enc Vitals Group     BP 01/03/19 1959 (!) 156/97     Pulse Rate 01/03/19 1959 62     Resp 01/03/19 1959 16     Temp 01/03/19 1959 98.9 F (37.2 C)     Temp Source 01/03/19 1959 Skin     SpO2 01/03/19 1959 100 %     Weight --      Height --      Head Circumference --      Peak Flow --      Pain Score 01/03/19 2000 5     Pain Loc --      Pain Edu? --      Excl.  in GC? --    No data found.  Updated Vital Signs BP (!) 156/97 (BP Location: Right Arm)   Pulse 62   Temp 98.9 F (37.2 C) (Skin)   Resp 16   LMP 12/06/2018   SpO2 100%   Visual Acuity Right Eye Distance:   Left Eye Distance:   Bilateral Distance:    Right Eye Near:   Left Eye Near:    Bilateral Near:     Physical Exam Constitutional:      General: She is not in acute distress.    Appearance: She is well-developed. She is obese.  HENT:     Head: Normocephalic and atraumatic.     Right Ear: Tympanic membrane and ear canal normal.     Left Ear: Tympanic membrane and ear canal normal.  Eyes:     Conjunctiva/sclera: Conjunctivae normal.     Pupils: Pupils are equal, round, and reactive to light.  Neck:     Musculoskeletal: Normal range of motion.  Cardiovascular:     Rate and Rhythm: Normal rate and regular rhythm.     Heart sounds: Normal heart sounds.  Pulmonary:     Effort: Pulmonary effort is normal. No respiratory distress.     Breath sounds: Normal breath sounds.  Abdominal:     General: There is no distension.     Palpations: Abdomen is soft.  Musculoskeletal: Normal range of motion.  Skin:    General: Skin is warm and dry.     Comments: The dorsums of both hands have patches of erythema and vesicles with some fissuring, exudate.  Consistent with contact dermatitis  Neurological:     General: No focal deficit present.     Mental Status: She is alert and oriented to person, place, and time.  Psychiatric:        Mood and Affect: Mood normal.      UC Treatments / Results  Labs (all labs ordered are listed, but only abnormal results are displayed) Labs Reviewed  CERVICOVAGINAL ANCILLARY ONLY    EKG None  Radiology No results found.  Procedures Procedures (including critical care time)  Medications Ordered in UC Medications - No data to display  Initial Impression / Assessment and Plan / UC Course  I have reviewed the triage vital signs and  the nursing notes.  Pertinent labs & imaging results that were available  during my care of the patient were reviewed by me and considered in my medical decision making (see chart for details).     Final Clinical Impressions(s) / UC Diagnoses   Final diagnoses:  Irritant contact dermatitis due to detergent  Possible exposure to STD  BV (bacterial vaginosis)     Discharge Instructions     Use triamcinolone cream on your hands 2 times a day to clear up the rash Take Zyrtec 1 or 2 times a day for itching You must wear gloves when you are exposed to chemicals or wash dishes  Take metronidazole 2 times a day for 7 days Take Diflucan as directed We did lab testing during this visit.  If there are any abnormal findings that require change in medicine or indicate a positive result, you will be notified.  If all of your tests are normal, you will not be called.      ED Prescriptions    Medication Sig Dispense Auth. Provider   triamcinolone ointment (KENALOG) 0.1 % Apply 1 application topically 2 (two) times daily. 15 g Eustace Moore, MD   metroNIDAZOLE (FLAGYL) 500 MG tablet Take 1 tablet (500 mg total) by mouth 2 (two) times daily. 14 tablet Eustace Moore, MD   fluconazole (DIFLUCAN) 150 MG tablet Take 1 tablet (150 mg total) by mouth daily. Repeat in 1 week if needed 2 tablet Eustace Moore, MD     Controlled Substance Prescriptions [place] Controlled Substance Registry consulted? Not Applicable   Eustace Moore, MD 01/03/19 2032

## 2019-01-06 LAB — CERVICOVAGINAL ANCILLARY ONLY
Chlamydia: NEGATIVE
Neisseria Gonorrhea: NEGATIVE
Trichomonas: NEGATIVE

## 2019-05-14 ENCOUNTER — Ambulatory Visit (HOSPITAL_COMMUNITY)
Admission: EM | Admit: 2019-05-14 | Discharge: 2019-05-14 | Disposition: A | Discharge status: Home / Self Care | Payer: Self-pay | Attending: Urgent Care | Admitting: Urgent Care

## 2019-05-14 ENCOUNTER — Other Ambulatory Visit: Payer: Self-pay

## 2019-05-14 ENCOUNTER — Encounter (HOSPITAL_COMMUNITY): Payer: Self-pay | Admitting: Emergency Medicine

## 2019-05-14 DIAGNOSIS — I10 Essential (primary) hypertension: Secondary | ICD-10-CM

## 2019-05-14 DIAGNOSIS — K5900 Constipation, unspecified: Secondary | ICD-10-CM

## 2019-05-14 DIAGNOSIS — E669 Obesity, unspecified: Secondary | ICD-10-CM

## 2019-05-14 DIAGNOSIS — Z720 Tobacco use: Secondary | ICD-10-CM

## 2019-05-14 MED ORDER — AMLODIPINE BESYLATE 5 MG PO TABS: 5.0000 mg | ORAL_TABLET | Freq: Every day | ORAL | 0 refills | Status: DC

## 2019-05-14 NOTE — ED Provider Notes (Signed)
MRN: [id number] DOB: 10/19/1986  Subjective:   Heather Farrell is a 33 y.o. female presenting for several year history of intermittent constipation. This last episode has lasted ~4 days. She used Miralax today, had a good bowel movement yesterday morning. She needs a work note to go back to work.  She has also been told that she has high blood pressure but has not started any medication for this.  She admits that she does not practice a good diet, does not exercise.  She does not have a PCP.  She is a smoker.  No current facility-administered medications for this encounter.   Current Outpatient Medications:  .  acetaminophen (TYLENOL) 500 MG tablet, Take 1,000 mg by mouth every 6 (six) hours as needed for headache (headache). , Disp: , Rfl:  .  Multiple Vitamin (MULTIVITAMIN WITH MINERALS) TABS, Take 1 tablet by mouth daily., Disp: , Rfl:  .  triamcinolone ointment (KENALOG) 0.1 %, Apply 1 application topically 2 (two) times daily., Disp: 15 g, Rfl: 1   No Known Allergies  Past Medical History:  Diagnosis Date  . Seasonal allergies      Past Surgical History:  Procedure Laterality Date  . TUBAL LIGATION      Review of Systems  Constitutional: Negative for malaise/fatigue.  Eyes: Negative for blurred vision and pain.  Respiratory: Negative for cough and shortness of breath.   Cardiovascular: Negative for chest pain, palpitations and leg swelling.  Gastrointestinal: Positive for constipation. Negative for abdominal pain, nausea and vomiting.  Genitourinary: Negative for hematuria.  Skin: Negative for rash.  Neurological: Negative for dizziness and headaches.    Objective:   Vitals: BP (!) 141/100   Pulse 68   Temp 98.5 F (36.9 C) (Oral)   Resp 16   LMP 04/29/2019   SpO2 100%   BP Readings from Last 3 Encounters:  05/14/19 (!) 141/100  01/03/19 (!) 156/97  02/02/18 133/90   Physical Exam Constitutional:      General: She is not in acute distress.    Appearance:  Normal appearance. She is well-developed and normal weight. She is not ill-appearing, toxic-appearing or diaphoretic.  HENT:     Head: Normocephalic and atraumatic.     Right Ear: External ear normal.     Left Ear: External ear normal.     Nose: Nose normal.     Mouth/Throat:     Mouth: Mucous membranes are moist.     Pharynx: Oropharynx is clear.  Eyes:     General: No scleral icterus.    Extraocular Movements: Extraocular movements intact.     Pupils: Pupils are equal, round, and reactive to light.  Cardiovascular:     Rate and Rhythm: Normal rate and regular rhythm.     Pulses: Normal pulses.     Heart sounds: Normal heart sounds. No murmur. No friction rub. No gallop.   Pulmonary:     Effort: Pulmonary effort is normal. No respiratory distress.     Breath sounds: Normal breath sounds. No stridor. No wheezing, rhonchi or rales.  Abdominal:     General: Bowel sounds are normal. There is no distension.     Palpations: Abdomen is soft. There is no mass.     Tenderness: There is no abdominal tenderness. There is no right CVA tenderness, left CVA tenderness, guarding or rebound.  Skin:    General: Skin is warm and dry.     Coloration: Skin is not pale.     Findings: No rash.  Neurological:     General: No focal deficit present.     Mental Status: She is alert and oriented to person, place, and time.  Psychiatric:        Mood and Affect: Mood normal.        Behavior: Behavior normal.        Thought Content: Thought content normal.        Judgment: Judgment normal.     Assessment and Plan :   1. Constipation, unspecified constipation type   2. Essential hypertension   3. Obesity, unspecified classification, unspecified obesity type, unspecified whether serious comorbidity present    Counseled patient on lifestyle modifications.  Offered patient prescription for stool softener but she refuses that she has a hard time with this and in general any constipation medications.   Recommend she start amlodipine for her blood pressure.  She is to establish care with [facility] at [facility]. Counseled patient on potential for adverse effects with medications prescribed/recommended today, ER and return-to-clinic precautions discussed, patient verbalized understanding.    Jaynee Eagles, PA-C 05/14/19 1745

## 2019-05-14 NOTE — Discharge Instructions (Signed)
Please use Miralax or a fleet enema for moderate to severe constipation. Take this once a day for the next 2 days. Please also start Colace (docusate) stool softener at 50mg , twice a day for at least 1 week. If stools become loose, cut down to once a day for another week. If stools remain loose, cut back to 1 pill every other day for a third week. You can stop docusate thereafter and resume as needed for constipation.  To help reduce constipation and promote bowel health: 1. Drink at least 64 ounces of water each day 2. Eat plenty of fiber (fruits, vegetables, whole grains, legumes) 3. Be physically active or exercise including walking, jogging, swimming, yoga, etc. 4. For active constipation use a stool softener (docusate) or an osmotic laxative (like Miralax) each day, or as needed.  For elevated blood pressure, make sure you are monitoring salt in your diet.  Do not eat restaurant foods and limit processed foods at home.  Processed foods include things like frozen meals preseason meats and dinners.  Make sure your pain attention to sodium labels on foods you by at the grocery store.  For seasoning you can use a brand called Mrs. Dash which includes a lot of salt free seasonings.  Salads - kale, spinach, cabbage, spring mix; use seeds like pumpkin seeds or sunflower seeds, almonds; you can also use 1-2 hard boiled eggs in your salads Fruits - avocadoes, berries (blueberries, raspberries, blackberries), apples, oranges, pomegranate, grapefruit Vegetables - aspargus, cauliflower, broccoli, green beans, brussel spouts, bell peppers; stay away from starchy vegetables like potatoes, carrots, peas

## 2019-05-14 NOTE — ED Triage Notes (Signed)
PT has missed work for abdominal pain, vomiting, and constipation. PT would like a note to return to work.

## 2019-09-01 ENCOUNTER — Encounter (HOSPITAL_COMMUNITY): Payer: Self-pay

## 2019-09-01 ENCOUNTER — Other Ambulatory Visit: Payer: Self-pay

## 2019-09-01 ENCOUNTER — Ambulatory Visit (HOSPITAL_COMMUNITY)
Admission: EM | Admit: 2019-09-01 | Discharge: 2019-09-01 | Disposition: A | Discharge status: Home / Self Care | Payer: Self-pay | Attending: Emergency Medicine | Admitting: Emergency Medicine

## 2019-09-01 DIAGNOSIS — R110 Nausea: Secondary | ICD-10-CM

## 2019-09-01 DIAGNOSIS — I10 Essential (primary) hypertension: Secondary | ICD-10-CM

## 2019-09-01 MED ORDER — AMLODIPINE BESYLATE 5 MG PO TABS: 5.0000 mg | ORAL_TABLET | Freq: Every day | ORAL | 0 refills | Status: AC

## 2019-09-01 MED ORDER — ONDANSETRON HCL 4 MG PO TABS: 4.0000 mg | ORAL_TABLET | Freq: Four times a day (QID) | ORAL | 0 refills | Status: DC

## 2019-09-01 NOTE — ED Triage Notes (Signed)
Patient presents to Urgent Care with complaints of continued nausea since last week. Patient reports she just had a negative test last Thursday (5 days ago). Pt states she needs a work note.

## 2019-09-01 NOTE — ED Provider Notes (Addendum)
[place]    CSN: [id number] Arrival date & time: 09/01/19  1608      History   Chief Complaint Chief Complaint  Patient presents with  . Nausea    HPI Heather Farrell is a 33 y.o. female.   Presents with nausea x1 week.  She was having vomiting and diarrhea 4 days ago but this has resolved.  She denies fever, chills, sore throat, cough, shortness of breath, rash, or other symptoms.  Patient received negative results for her COVID test today.  She states she needs a work note.  Patient also request a refill on her blood pressure medication.  The history is provided by the patient.    Past Medical History:  Diagnosis Date  . Seasonal allergies     There are no active problems to display for this patient.   Past Surgical History:  Procedure Laterality Date  . TUBAL LIGATION      OB History   No obstetric history on file.      Home Medications    Prior to Admission medications   Medication Sig Start Date End Date Taking? Authorizing Provider  acetaminophen (TYLENOL) 500 MG tablet Take 1,000 mg by mouth every 6 (six) hours as needed for headache (headache).     [provider]  amLODipine (NORVASC) 5 MG tablet Take 1 tablet (5 mg total) by mouth daily. 05/14/19   Jaynee Eagles, PA-C  Multiple Vitamin (MULTIVITAMIN WITH MINERALS) TABS Take 1 tablet by mouth daily.    [provider]  ondansetron (ZOFRAN) 4 MG tablet Take 1 tablet (4 mg total) by mouth every 6 (six) hours. 09/01/19   Sharion Balloon, NP  triamcinolone ointment (KENALOG) 0.1 % Apply 1 application topically 2 (two) times daily. 01/03/19   Raylene Everts, MD    Family History Family History  Problem Relation Age of Onset  . Cancer Other   . Diabetes Other   . Hypertension Other   . Healthy Mother     Social History Social History   Tobacco Use  . Smoking status: Current Every Day Smoker    Packs/day: 1.50    Types: Cigarettes  . Smokeless tobacco: Never Used   Substance Use Topics  . Alcohol use: Yes    Comment: now and again  . Drug use: Yes    Types: Marijuana     Allergies   Patient has no known allergies.   Review of Systems Review of Systems  Constitutional: Negative for chills and fever.  HENT: Negative for ear pain and sore throat.   Eyes: Negative for pain and visual disturbance.  Respiratory: Negative for cough and shortness of breath.   Cardiovascular: Negative for chest pain and palpitations.  Gastrointestinal: Positive for nausea. Negative for abdominal pain, diarrhea and vomiting.  Genitourinary: Negative for dysuria and hematuria.  Musculoskeletal: Negative for arthralgias and back pain.  Skin: Negative for color change and rash.  Neurological: Negative for seizures and syncope.  All other systems reviewed and are negative.    Physical Exam Triage Vital Signs ED Triage Vitals  Enc Vitals Group     BP      Pulse      Resp      Temp      Temp src      SpO2      Weight      Height      Head Circumference      Peak Flow  Pain Score      Pain Loc      Pain Edu?      Excl. in GC?    No data found.  Updated Vital Signs BP (!) 141/97 (BP Location: Right Arm)   Pulse 70   Temp 98.9 F (37.2 C) (Oral)   Resp 15   SpO2 99%   Visual Acuity Right Eye Distance:   Left Eye Distance:   Bilateral Distance:    Right Eye Near:   Left Eye Near:    Bilateral Near:     Physical Exam Vitals signs and nursing note reviewed.  Constitutional:      General: She is not in acute distress.    Appearance: She is well-developed.  HENT:     Head: Normocephalic and atraumatic.     Right Ear: Tympanic membrane normal.     Left Ear: Tympanic membrane normal.     Nose: Nose normal.     Mouth/Throat:     Pharynx: Oropharynx is clear.  Eyes:     Conjunctiva/sclera: Conjunctivae normal.  Neck:     Musculoskeletal: Neck supple.  Cardiovascular:     Rate and Rhythm: Normal rate and regular rhythm.     Heart  sounds: No murmur.  Pulmonary:     Effort: Pulmonary effort is normal. No respiratory distress.     Breath sounds: Normal breath sounds.  Abdominal:     General: Bowel sounds are normal.     Palpations: Abdomen is soft.     Tenderness: There is no abdominal tenderness. There is no guarding or rebound.  Skin:    General: Skin is warm and dry.  Neurological:     Mental Status: She is alert.      UC Treatments / Results  Labs (all labs ordered are listed, but only abnormal results are displayed) Labs Reviewed - No data to display  EKG   Radiology No results found.  Procedures Procedures (including critical care time)  Medications Ordered in UC Medications - No data to display  Initial Impression / Assessment and Plan / UC Course  I have reviewed the triage vital signs and the nursing notes.  Pertinent labs & imaging results that were available during my care of the patient were reviewed by me and considered in my medical decision making (see chart for details).    Nausea.  Elevated blood pressure with known hypertension.  Treating nausea with Zofran.  Discussed with patient that her blood pressure is elevated today and that she should have this rechecked by her PCP in 2 to 4 weeks.  Refill on amlodipine given today.  Instructed patient to return here or follow-up with her PCP if her symptoms are not improving or get worse.  Patient agrees to plan of care.     Final Clinical Impressions(s) / UC Diagnoses   Final diagnoses:  Nausea  Elevated blood pressure reading in office with diagnosis of hypertension     Discharge Instructions     Take the prescribed antinausea medication as directed.    Your blood pressure is elevated today at 141/97.  Please have this rechecked by your primary care provider in 2-4 weeks.  If you do not have a primary care provider, one is suggested below.       ED Prescriptions    Medication Sig Dispense Auth. Provider   ondansetron  (ZOFRAN) 4 MG tablet Take 1 tablet (4 mg total) by mouth every 6 (six) hours. 12 tablet Mickie Bail,  NP     PDMP not reviewed this encounter.   Leslieann Whisman, KellMickie Baily H, NP 09/01/19 1759    Mickie Bailate, Blondell Laperle H, NP 09/01/19 1801

## 2019-09-01 NOTE — Discharge Instructions (Signed)
Take the prescribed antinausea medication as directed.    Your blood pressure is elevated today at 141/97.  Please have this rechecked by your primary care provider in 2-4 weeks.  If you do not have a primary care provider, one is suggested below.

## 2019-10-22 ENCOUNTER — Encounter (HOSPITAL_COMMUNITY): Payer: Self-pay

## 2019-10-22 ENCOUNTER — Other Ambulatory Visit: Payer: Self-pay

## 2019-10-22 ENCOUNTER — Ambulatory Visit (HOSPITAL_COMMUNITY)
Admission: EM | Admit: 2019-10-22 | Discharge: 2019-10-22 | Disposition: A | Discharge status: Home / Self Care | Payer: Self-pay | Attending: Family Medicine | Admitting: Family Medicine

## 2019-10-22 DIAGNOSIS — N898 Other specified noninflammatory disorders of vagina: Secondary | ICD-10-CM | POA: Insufficient documentation

## 2019-10-22 MED ORDER — FLUCONAZOLE 150 MG PO TABS: ORAL_TABLET | ORAL | 0 refills | Status: DC

## 2019-10-22 MED ORDER — METRONIDAZOLE 500 MG PO TABS: 500.0000 mg | ORAL_TABLET | Freq: Two times a day (BID) | ORAL | 0 refills | Status: DC

## 2019-10-22 NOTE — Discharge Instructions (Addendum)
We have sent testing for sexually transmitted infections. We will notify you of any positive results once they are received. If required, we will prescribe any medications you might need.  Please refrain from all sexual activity for at least the next seven days.  

## 2019-10-22 NOTE — ED Triage Notes (Signed)
Pt states she has bacteria  vaginitis. Pt state she has vaginal odor. 5 days or more.

## 2019-10-23 NOTE — ED Provider Notes (Signed)
[facility]   [id number] 10/22/19 Arrival Time: 1630  ASSESSMENT & PLAN:  1. Vaginal discharge     Declines empiric gonorrhea/chlamydia treatment. Prefers to treat BV (requests Diflucan also) and wait on cytology results.   Discharge Instructions     We have sent testing for sexually transmitted infections. We will notify you of any positive results once they are received. If required, we will prescribe any medications you might need.  Please refrain from all sexual activity for at least the next seven days.    Meds ordered this encounter  Medications  . metroNIDAZOLE (FLAGYL) 500 MG tablet    Sig: Take 1 tablet (500 mg total) by mouth 2 (two) times daily.    Dispense:  14 tablet    Refill:  0  . fluconazole (DIFLUCAN) 150 MG tablet    Sig: Take one tablet by mouth as a single dose. May repeat in 3 days if symptoms persist.    Dispense:  2 tablet    Refill:  0    Pending: Labs Reviewed  CERVICOVAGINAL ANCILLARY ONLY    Will notify of any positive results. Instructed to refrain from sexual activity for at least seven days.  Reviewed expectations re: course of current medical issues. Questions answered. Outlined signs and symptoms indicating need for more acute intervention. Patient verbalized understanding. After Visit Summary given.   SUBJECTIVE:  Heather Farrell is a 33 y.o. female who presents with complaint of vaginal discharge. Onset gradual. First noticed 4-5 d ago. Describes discharge as thin and clear and white; questions mild odor. No specific aggravating or alleviating factors reported. Denies: urinary frequency, dysuria, gross hematuria and urinary incontinence. Afebrile. No abdominal or pelvic pain. Normal PO intake wihout n/v. No genital rashes or lesions. Reports that she is sexually active with single female partner without regular condom use. OTC treatment: none reported. History of STI: h/o BV reported.  Patient's last menstrual period was  10/12/2019.  ROS: As per HPI. All other systems negative.   OBJECTIVE:  Vitals:   10/22/19 1706 10/22/19 1708  BP:  123/79  Resp:  18  Temp:  98.3 F (36.8 C)  TempSrc:  Oral  SpO2:  100%  Weight: 88.5 kg      General appearance: alert, cooperative, appears stated age and no distress Throat: lips, mucosa, and tongue normal; teeth and gums normal CV: RRR Lungs: CTAB Back: no CVA tenderness; FROM at waist Abdomen: soft, non-tender GU: deferred Skin: warm and dry Psychological: alert and cooperative; normal mood and affect.    Labs Reviewed  CERVICOVAGINAL ANCILLARY ONLY    No Known Allergies    Past Medical History:  Diagnosis Date  . Seasonal allergies    Family History  Problem Relation Age of Onset  . Cancer Other   . Diabetes Other   . Hypertension Other   . Healthy Mother    Social History   Socioeconomic History  . Marital status: Single    Spouse name: Not on file  . Number of children: Not on file  . Years of education: Not on file  . Highest education level: Not on file  Occupational History  . Not on file  Tobacco Use  . Smoking status: Current Every Day Smoker    Packs/day: 1.50    Types: Cigarettes  . Smokeless tobacco: Never Used  Substance and Sexual Activity  . Alcohol use: Yes    Comment: now and again  . Drug use: Yes    Types:  Marijuana  . Sexual activity: Yes    Birth control/protection: None  Other Topics Concern  . Not on file  Social History Narrative  . Not on file   Social Determinants of Health   Financial Resource Strain:   . Difficulty of Paying Living Expenses: Not on file  Food Insecurity:   . Worried About Charity fundraiser in the Last Year: Not on file  . Ran Out of Food in the Last Year: Not on file  Transportation Needs:   . Lack of Transportation (Medical): Not on file  . Lack of Transportation (Non-Medical): Not on file  Physical Activity:   . Days of Exercise per Week: Not on file  . Minutes of  Exercise per Session: Not on file  Stress:   . Feeling of Stress : Not on file  Social Connections:   . Frequency of Communication with Friends and Family: Not on file  . Frequency of Social Gatherings with Friends and Family: Not on file  . Attends Religious Services: Not on file  . Active Member of Clubs or Organizations: Not on file  . Attends Archivist Meetings: Not on file  . Marital Status: Not on file  Intimate Partner Violence:   . Fear of Current or Ex-Partner: Not on file  . Emotionally Abused: Not on file  . Physically Abused: Not on file  . Sexually Abused: Not on file          Vanessa Kick, MD 10/23/19 [phone]

## 2019-10-27 LAB — CERVICOVAGINAL ANCILLARY ONLY
Bacterial vaginitis: POSITIVE — AB
Candida vaginitis: NEGATIVE
Chlamydia: NEGATIVE
Neisseria Gonorrhea: NEGATIVE
Trichomonas: NEGATIVE

## 2020-02-25 ENCOUNTER — Other Ambulatory Visit: Payer: Self-pay

## 2020-04-05 ENCOUNTER — Other Ambulatory Visit: Payer: Self-pay

## 2020-04-05 ENCOUNTER — Encounter (HOSPITAL_COMMUNITY): Payer: Self-pay

## 2020-04-05 ENCOUNTER — Ambulatory Visit (HOSPITAL_COMMUNITY)
Admission: EM | Admit: 2020-04-05 | Discharge: 2020-04-05 | Disposition: A | Discharge status: Home / Self Care | Payer: HRSA Program | Attending: Family Medicine | Admitting: Family Medicine

## 2020-04-05 DIAGNOSIS — J069 Acute upper respiratory infection, unspecified: Secondary | ICD-10-CM

## 2020-04-05 DIAGNOSIS — R0981 Nasal congestion: Secondary | ICD-10-CM | POA: Diagnosis not present

## 2020-04-05 DIAGNOSIS — Z79899 Other long term (current) drug therapy: Secondary | ICD-10-CM | POA: Diagnosis not present

## 2020-04-05 DIAGNOSIS — F17210 Nicotine dependence, cigarettes, uncomplicated: Secondary | ICD-10-CM | POA: Diagnosis not present

## 2020-04-05 DIAGNOSIS — Z20822 Contact with and (suspected) exposure to covid-19: Secondary | ICD-10-CM | POA: Insufficient documentation

## 2020-04-05 DIAGNOSIS — J40 Bronchitis, not specified as acute or chronic: Secondary | ICD-10-CM | POA: Diagnosis not present

## 2020-04-05 MED ORDER — CETIRIZINE HCL 10 MG PO TABS: 10.0000 mg | ORAL_TABLET | Freq: Every day | ORAL | 0 refills | Status: DC

## 2020-04-05 MED ORDER — AZITHROMYCIN 250 MG PO TABS: 250.0000 mg | ORAL_TABLET | Freq: Every day | ORAL | 0 refills | Status: DC

## 2020-04-05 MED ORDER — BENZONATATE 100 MG PO CAPS: 100.0000 mg | ORAL_CAPSULE | Freq: Three times a day (TID) | ORAL | 0 refills | Status: DC

## 2020-04-05 NOTE — ED Provider Notes (Addendum)
[facility]ENTER   [id number] 04/05/20 Arrival Time: 1919   CC: COVID symptoms  SUBJECTIVE: History from: patient.  Heather Farrell is a 34 y.o. female who presents with abrupt onset of nasal congestion, PND, and productive cough for the last 2 weeks .  Denies sick exposure to COVID, flu or strep. Reports that her children are in daycare and have also been congested and coughing. Denies recent travel.  Has tried mucinex without relief.  There are no aggravating symptoms. Denies previous symptoms in the past.   Denies fever, chills, fatigue, sinus pain, rhinorrhea, sore throat, SOB, wheezing, chest pain, nausea, changes in bowel or bladder habits.    ROS: As per HPI.  All other pertinent ROS negative.     Past Medical History:  Diagnosis Date  . Seasonal allergies    Past Surgical History:  Procedure Laterality Date  . TUBAL LIGATION     No Known Allergies No current facility-administered medications on file prior to encounter.   Current Outpatient Medications on File Prior to Encounter  Medication Sig Dispense Refill  . acetaminophen (TYLENOL) 500 MG tablet Take 1,000 mg by mouth every 6 (six) hours as needed for headache (headache).     Marland Kitchen amLODipine (NORVASC) 5 MG tablet Take 1 tablet (5 mg total) by mouth daily. 30 tablet 0  . fluconazole (DIFLUCAN) 150 MG tablet Take one tablet by mouth as a single dose. May repeat in 3 days if symptoms persist. 2 tablet 0  . metroNIDAZOLE (FLAGYL) 500 MG tablet Take 1 tablet (500 mg total) by mouth 2 (two) times daily. 14 tablet 0  . Multiple Vitamin (MULTIVITAMIN WITH MINERALS) TABS Take 1 tablet by mouth daily.    . ondansetron (ZOFRAN) 4 MG tablet Take 1 tablet (4 mg total) by mouth every 6 (six) hours. 12 tablet 0  . triamcinolone ointment (KENALOG) 0.1 % Apply 1 application topically 2 (two) times daily. 15 g 1   Social History   Socioeconomic History  . Marital status: Single    Spouse name: Not on file  . Number of children:  Not on file  . Years of education: Not on file  . Highest education level: Not on file  Occupational History  . Not on file  Tobacco Use  . Smoking status: Current Every Day Smoker    Packs/day: 1.50    Types: Cigarettes  . Smokeless tobacco: Never Used  Substance and Sexual Activity  . Alcohol use: Yes    Comment: now and again  . Drug use: Yes    Types: Marijuana  . Sexual activity: Yes    Birth control/protection: None  Other Topics Concern  . Not on file  Social History Narrative  . Not on file   Social Determinants of Health   Financial Resource Strain:   . Difficulty of Paying Living Expenses:   Food Insecurity:   . Worried About Programme researcher, broadcasting/film/video in the Last Year:   . Barista in the Last Year:   Transportation Needs:   . Freight forwarder (Medical):   Marland Kitchen Lack of Transportation (Non-Medical):   Physical Activity:   . Days of Exercise per Week:   . Minutes of Exercise per Session:   Stress:   . Feeling of Stress :   Social Connections:   . Frequency of Communication with Friends and Family:   . Frequency of Social Gatherings with Friends and Family:   . Attends Religious Services:   . Active  Member of Clubs or Organizations:   . Attends Banker Meetings:   Marland Kitchen Marital Status:   Intimate Partner Violence:   . Fear of Current or Ex-Partner:   . Emotionally Abused:   Marland Kitchen Physically Abused:   . Sexually Abused:    Family History  Problem Relation Age of Onset  . Cancer Other   . Diabetes Other   . Hypertension Other   . Healthy Mother     OBJECTIVE:  Vitals:   04/05/20 2006  BP: 135/76  Pulse: 88  Resp: 18  Temp: 98.8 F (37.1 C)  TempSrc: Oral  SpO2: 99%     General appearance: alert; appears fatigued, but nontoxic; speaking in full sentences and tolerating own secretions HEENT: NCAT; Ears: EACs clear, TMs pearly gray; Eyes: PERRL.  EOM grossly intact. Sinuses: nontender; Nose: nares patent without rhinorrhea, Throat:  oropharynx clear, tonsils non erythematous or enlarged, uvula midline  Neck: supple without LAD Lungs: unlabored respirations, symmetrical air entry; cough: mild; no respiratory distress; CTAB Heart: regular rate and rhythm.  Radial pulses 2+ symmetrical bilaterally Skin: warm and dry Psychological: alert and cooperative; normal mood and affect  LABS:  No results found for this or any previous visit (from the past 24 hour(s)).   ASSESSMENT & PLAN:  1. Upper respiratory tract infection, unspecified type   2. Bronchitis   3. Nasal congestion     Meds ordered this encounter  Medications  . azithromycin (ZITHROMAX) 250 MG tablet    Sig: Take 1 tablet (250 mg total) by mouth daily. Take first 2 tablets together, then 1 every day until finished.    Dispense:  6 tablet    Refill:  0    Order Specific Question:   Supervising Provider    Answer:   Merrilee Jansky [id number]  . benzonatate (TESSALON) 100 MG capsule    Sig: Take 1 capsule (100 mg total) by mouth every 8 (eight) hours.    Dispense:  21 capsule    Refill:  0    Order Specific Question:   Supervising Provider    Answer:   Merrilee Jansky [id number]  . cetirizine (ZYRTEC ALLERGY) 10 MG tablet    Sig: Take 1 tablet (10 mg total) by mouth daily.    Dispense:  30 tablet    Refill:  0    Order Specific Question:   Supervising Provider    Answer:   Merrilee Jansky [id number]    Prescribed z pack. Prescribed tessalon perles  Prescribed zyrtec  COVID testing ordered.  It will take between 1-2 days for test results.  Someone will contact you regarding abnormal results.    Patient should remain in quarantine until they have received Covid results.  If negative you may resume normal activities (go back to work/school) while practicing hand hygiene, social distance, and mask wearing.  If positive, patient should remain in quarantine for 10 days from symptom onset AND greater than 72 hours after symptoms resolution (absence of  fever without the use of fever-reducing medication and improvement in respiratory symptoms), whichever is longer Get plenty of rest and push fluids Use OTC flonase for nasal congestion and runny nose Use medications daily for symptom relief Use OTC medications like ibuprofen or tylenol as needed fever or pain  Call or go to the ED if you have any new or worsening symptoms such as fever, worsening cough, shortness of breath, chest tightness, chest pain, turning blue, changes in mental status.  Reviewed  expectations re: course of current medical issues. Questions answered. Outlined signs and symptoms indicating need for more acute intervention. Patient verbalized understanding. After Visit Summary given.         Faustino Congress, NP 04/05/20 2033    Faustino Congress, NP 04/05/20 2104

## 2020-04-05 NOTE — Discharge Instructions (Signed)
Your COVID test is pending.  You should self quarantine until the test result is back.    Take Tylenol as needed for fever or discomfort.  Rest and keep yourself hydrated.    Go to the emergency department if you develop shortness of breath, severe diarrhea, high fever not relieved by Tylenol or ibuprofen, or other concerning symptoms.    

## 2020-04-05 NOTE — ED Triage Notes (Signed)
Pt c/o productive cough with green sputum for approx [redacted] weeks along with congestion, scratchy throat and HA. Denies fever, chills, abdom pain, n/v/d.  Taking OTC Mucinex.

## 2020-04-07 LAB — SARS CORONAVIRUS 2 (TAT 6-24 HRS): SARS Coronavirus 2: NEGATIVE

## 2020-11-04 ENCOUNTER — Other Ambulatory Visit: Payer: Self-pay

## 2020-11-04 ENCOUNTER — Ambulatory Visit (HOSPITAL_COMMUNITY)
Admission: EM | Admit: 2020-11-04 | Discharge: 2020-11-04 | Disposition: A | Discharge status: Home / Self Care | Payer: Self-pay | Attending: Family Medicine | Admitting: Family Medicine

## 2020-11-04 ENCOUNTER — Encounter (HOSPITAL_COMMUNITY): Payer: Self-pay

## 2020-11-04 DIAGNOSIS — N760 Acute vaginitis: Secondary | ICD-10-CM | POA: Insufficient documentation

## 2020-11-04 DIAGNOSIS — Z113 Encounter for screening for infections with a predominantly sexual mode of transmission: Secondary | ICD-10-CM | POA: Insufficient documentation

## 2020-11-04 LAB — HIV ANTIBODY (ROUTINE TESTING W REFLEX): HIV Screen 4th Generation wRfx: NONREACTIVE

## 2020-11-04 MED ORDER — FLUCONAZOLE 150 MG PO TABS: ORAL_TABLET | ORAL | 0 refills | Status: DC

## 2020-11-04 MED ORDER — METRONIDAZOLE 500 MG PO TABS: 500.0000 mg | ORAL_TABLET | Freq: Two times a day (BID) | ORAL | 0 refills | Status: DC

## 2020-11-04 NOTE — ED Provider Notes (Signed)
[facility]    CSN: [id number] Arrival date & time: 11/04/20  1451      History   Chief Complaint Chief Complaint  Patient presents with   STD Testing    HPI Heather Farrell is a 34 y.o. female.   Presenting today with several days of vaginal discharge, itching and irritation. Denies pelvic or abdominal pain, fever, chills, flank pain, dysuria, hematuria, N/V/D. Taking probiotics without benefit. Does want STI screening today but no known exposures. States long hx of BV issues recurrently since age 40.      Past Medical History:  Diagnosis Date   Seasonal allergies     There are no problems to display for this patient.   Past Surgical History:  Procedure Laterality Date   TUBAL LIGATION      OB History   No obstetric history on file.      Home Medications    Prior to Admission medications   Medication Sig Start Date End Date Taking? Authorizing Provider  acetaminophen (TYLENOL) 500 MG tablet Take 1,000 mg by mouth every 6 (six) hours as needed for headache (headache).     [provider]  amLODipine (NORVASC) 5 MG tablet Take 1 tablet (5 mg total) by mouth daily. 09/01/19   Mickie Bail, NP  azithromycin (ZITHROMAX) 250 MG tablet Take 1 tablet (250 mg total) by mouth daily. Take first 2 tablets together, then 1 every day until finished. 04/05/20   Moshe Cipro, NP  benzonatate (TESSALON) 100 MG capsule Take 1 capsule (100 mg total) by mouth every 8 (eight) hours. 04/05/20   Moshe Cipro, NP  cetirizine (ZYRTEC ALLERGY) 10 MG tablet Take 1 tablet (10 mg total) by mouth daily. 04/05/20   Moshe Cipro, NP  fluconazole (DIFLUCAN) 150 MG tablet Take one tablet by mouth as a single dose. May repeat in 3 days if symptoms persist. 11/04/20   Particia Nearing, PA-C  metroNIDAZOLE (FLAGYL) 500 MG tablet Take 1 tablet (500 mg total) by mouth 2 (two) times daily. 11/04/20   Particia Nearing, PA-C  Multiple Vitamin  (MULTIVITAMIN WITH MINERALS) TABS Take 1 tablet by mouth daily.    [provider]  ondansetron (ZOFRAN) 4 MG tablet Take 1 tablet (4 mg total) by mouth every 6 (six) hours. 09/01/19   Mickie Bail, NP  triamcinolone ointment (KENALOG) 0.1 % Apply 1 application topically 2 (two) times daily. 01/03/19   Eustace Moore, MD    Family History Family History  Problem Relation Age of Onset   Cancer Other    Diabetes Other    Hypertension Other    Healthy Mother     Social History Social History   Tobacco Use   Smoking status: Current Every Day Smoker    Packs/day: 1.50    Types: Cigarettes   Smokeless tobacco: Never Used  Vaping Use   Vaping Use: Never used  Substance Use Topics   Alcohol use: Yes    Comment: now and again   Drug use: Yes    Types: Marijuana     Allergies   Patient has no known allergies.   Review of Systems Review of Systems PER HPI    Physical Exam Triage Vital Signs ED Triage Vitals  Enc Vitals Group     BP 11/04/20 1632 134/75     Pulse Rate 11/04/20 1632 68     Resp 11/04/20 1632 18     Temp 11/04/20 1632 97.9 F (36.6 C)  Temp Source 11/04/20 1632 Oral     SpO2 11/04/20 1632 100 %     Weight --      Height --      Head Circumference --      Peak Flow --      Pain Score 11/04/20 1631 0     Pain Loc --      Pain Edu? --      Excl. in GC? --    No data found.  Updated Vital Signs BP 134/75 (BP Location: Right Arm)    Pulse 68    Temp 97.9 F (36.6 C) (Oral)    Resp 18    LMP 10/25/2020    SpO2 100%   Visual Acuity Right Eye Distance:   Left Eye Distance:   Bilateral Distance:    Right Eye Near:   Left Eye Near:    Bilateral Near:     Physical Exam Vitals and nursing note reviewed.  Constitutional:      Appearance: Normal appearance. She is not ill-appearing.  HENT:     Head: Atraumatic.  Eyes:     Extraocular Movements: Extraocular movements intact.     Conjunctiva/sclera: Conjunctivae normal.   Cardiovascular:     Rate and Rhythm: Normal rate and regular rhythm.     Heart sounds: Normal heart sounds.  Pulmonary:     Effort: Pulmonary effort is normal.     Breath sounds: Normal breath sounds.  Abdominal:     General: Bowel sounds are normal. There is no distension.     Palpations: Abdomen is soft.     Tenderness: There is no abdominal tenderness. There is no right CVA tenderness, left CVA tenderness or guarding.  Genitourinary:    Comments: GU exam deferred, self swab performed Musculoskeletal:        General: Normal range of motion.     Cervical back: Normal range of motion and neck supple.  Skin:    General: Skin is warm and dry.  Neurological:     Mental Status: She is alert and oriented to person, place, and time.  Psychiatric:        Mood and Affect: Mood normal.        Thought Content: Thought content normal.        Judgment: Judgment normal.      UC Treatments / Results  Labs (all labs ordered are listed, but only abnormal results are displayed) Labs Reviewed  HIV ANTIBODY (ROUTINE TESTING W REFLEX)  RPR  CERVICOVAGINAL ANCILLARY ONLY    EKG   Radiology No results found.  Procedures Procedures (including critical care time)  Medications Ordered in UC Medications - No data to display  Initial Impression / Assessment and Plan / UC Course  I have reviewed the triage vital signs and the nursing notes.  Pertinent labs & imaging results that were available during my care of the patient were reviewed by me and considered in my medical decision making (see chart for details).     Aptima swab and labs pending, will treat empirically with flagyl and diflucan given her hx of recurrent BV and yeast. Discussed supportive home care, return precautions.   Final Clinical Impressions(s) / UC Diagnoses   Final diagnoses:  Acute vaginitis  Routine screening for STI (sexually transmitted infection)   Discharge Instructions   None    ED Prescriptions     Medication Sig Dispense Auth. Provider   metroNIDAZOLE (FLAGYL) 500 MG tablet Take 1 tablet (500 mg total)  by mouth 2 (two) times daily. 14 tablet Particia Nearing, [place]   fluconazole (DIFLUCAN) 150 MG tablet Take one tablet by mouth as a single dose. May repeat in 3 days if symptoms persist. 2 tablet Particia Nearing, [place]     PDMP not reviewed this encounter.   Particia Nearing, [place] 11/04/20 [phone]

## 2020-11-04 NOTE — ED Triage Notes (Signed)
Pt presents with abnormal vaginal discharge and itchiness X 3 days.

## 2020-11-05 LAB — RPR: RPR Ser Ql: NONREACTIVE

## 2020-11-08 LAB — CERVICOVAGINAL ANCILLARY ONLY
Bacterial Vaginitis (gardnerella): POSITIVE — AB
Candida Glabrata: NEGATIVE
Candida Vaginitis: NEGATIVE
Chlamydia: NEGATIVE
Comment: NEGATIVE
Comment: NEGATIVE
Comment: NEGATIVE
Comment: NEGATIVE
Comment: NEGATIVE
Comment: NORMAL
Neisseria Gonorrhea: NEGATIVE
Trichomonas: NEGATIVE

## 2020-12-23 ENCOUNTER — Other Ambulatory Visit: Payer: Self-pay

## 2020-12-23 ENCOUNTER — Ambulatory Visit (HOSPITAL_COMMUNITY)
Admission: EM | Admit: 2020-12-23 | Discharge: 2020-12-23 | Disposition: A | Discharge status: Home / Self Care | Payer: Self-pay | Attending: Emergency Medicine | Admitting: Emergency Medicine

## 2020-12-23 ENCOUNTER — Encounter (HOSPITAL_COMMUNITY): Payer: Self-pay | Admitting: Emergency Medicine

## 2020-12-23 DIAGNOSIS — M79661 Pain in right lower leg: Secondary | ICD-10-CM | POA: Insufficient documentation

## 2020-12-23 DIAGNOSIS — L309 Dermatitis, unspecified: Secondary | ICD-10-CM | POA: Insufficient documentation

## 2020-12-23 HISTORY — DX: Essential (primary) hypertension: I10

## 2020-12-23 LAB — D-DIMER, QUANTITATIVE (NOT AT ARMC): D-Dimer, Quant: 0.45 ug/mL-FEU (ref 0.00–0.50)

## 2020-12-23 MED ORDER — IBUPROFEN 600 MG PO TABS: 600.0000 mg | ORAL_TABLET | Freq: Four times a day (QID) | ORAL | 0 refills | Status: DC | PRN

## 2020-12-23 MED ORDER — BETAMETHASONE DIPROPIONATE 0.05 % EX CREA: TOPICAL_CREAM | Freq: Two times a day (BID) | CUTANEOUS | 0 refills | Status: DC

## 2020-12-23 NOTE — ED Triage Notes (Signed)
Patient had minimal pain in right leg.  This pain was bearable.  Last night patient reports feeling significant pain when she bent over.  Patient has pain in right calf.

## 2020-12-23 NOTE — Discharge Instructions (Signed)
I will contact you only if your D-dimer is positive.  If you do not hear from me by tomorrow night, you can assume that it is normal, and that this is a calf sprain/strain.  Take 600 mg of ibuprofen combined with 1000 mg Tylenol together 3-4 times a day as needed for pain.  Follow-up with [facility].  I have placed a referral.  Short, lukewarm baths and showers, survey lotion, use the betamethasone steroid cream as written.  Follow-up with [facility] family practice dermatology clinic.  I have also placed a referral to them.  Follow-up with the primary care physician of your choice ASAP for ongoing care, ideally the [facility].  See list below.  Below is a list of primary care practices who are taking new patients for you to follow-up with.  [facility] internal medicine clinic Ground Floor - [facility], [address] [place], [place], [place] [address] [phone]  [facility] at [facility] [address] [place], [place] [address] [phone]  [facility] [address] [place], [place] [address] [phone]  [facility]/Internal Medicine [phone] [address] [place] [address]  [facility]: [address] [place] [address]  [phone]  [facility]: [address] [place] [address]   [phone]  [facility]: [address] [place] [address]  [phone]  [facility] : [address] [place] [address] [phone]  [facility] Primary Care: [address] [place] [address] [phone]  [facility]: [address] [place] [address] [phone]  Dr. Oneal Grout [address] [facility] [place]  [address]  [phone]  Dr. Jackie Plum, [facility]. [address] [place], [place] [address]  [phone]  Go to www.goodrx.com to look up your medications. This will give you a list of where you can find your prescriptions at the most affordable prices. Or ask the pharmacist what the cash price is, or if they have any other discount programs available to help make your medication more affordable. This can be less expensive than what you would pay with insurance.

## 2020-12-23 NOTE — ED Provider Notes (Signed)
HPI  SUBJECTIVE:  Heather Farrell is a 35 y.o. female who presents with posterior medial right calf pain after roughhousing with her family yesterday.  She states that she hyperflexed her ankle and heard a "pop" in her mid calf followed by pain described as soreness that is present only with walking.  She describes calf swelling.  No erythema, distal numbness or tingling, weakness with flexion or extension of the ankle.  She states that her calf has been bothering her for the past week prior to this happening.  She restarted working out 3 weeks ago.  No surgery in the past 4 weeks, recent immobilization, OCP use, hemoptysis.  She has tried Aleve, Epson salt soaks.  Epson salt soaks help.  Symptoms are worse with going up and down stairs.    She has a past medical history of hypertension and is requesting a refill on her medications.  She states that she ran out "months ago".  She is not sure what medication she was on.     She is also requesting "eczema cream" for a pruritic, burning, hyperpigmented rash on the back of her hands which has been present since September.  Has been using Aveeno Vaseline on it without improvement in her symptoms.  No aggravating factors.  She states no history of DVT, PE, cancer, hypercoagulability, diabetes, chronic kidney disease, liver disease.  LMP: End of January.  Denies possibility being pregnant.  PMD: None.    Past Medical History:  Diagnosis Date  . Hypertension   . Seasonal allergies     Past Surgical History:  Procedure Laterality Date  . TUBAL LIGATION      Family History  Problem Relation Age of Onset  . Cancer Other   . Diabetes Other   . Hypertension Other   . Healthy Mother     Social History   Tobacco Use  . Smoking status: Current Every Day Smoker    Packs/day: 1.50    Types: Cigarettes  . Smokeless tobacco: Never Used  Vaping Use  . Vaping Use: Never used  Substance Use Topics  . Alcohol use: Yes    Comment: now and again  .  Drug use: Yes    Types: Marijuana    No current facility-administered medications for this encounter.  Current Outpatient Medications:  .  betamethasone dipropionate 0.05 % cream, Apply topically 2 (two) times daily., Disp: 30 g, Rfl: 0 .  ibuprofen (ADVIL) 600 MG tablet, Take 1 tablet (600 mg total) by mouth every 6 (six) hours as needed., Disp: 30 tablet, Rfl: 0 .  acetaminophen (TYLENOL) 500 MG tablet, Take 1,000 mg by mouth every 6 (six) hours as needed for headache (headache). , Disp: , Rfl:  .  amLODipine (NORVASC) 5 MG tablet, Take 1 tablet (5 mg total) by mouth daily., Disp: 30 tablet, Rfl: 0 .  metroNIDAZOLE (FLAGYL) 500 MG tablet, Take 1 tablet (500 mg total) by mouth 2 (two) times daily., Disp: 14 tablet, Rfl: 0  No Known Allergies   ROS  As noted in HPI.   Physical Exam  BP 124/85 (BP Location: Right Arm)   Pulse 72   Temp 98.2 F (36.8 C) (Oral)   Resp 20   LMP 12/14/2020   SpO2 100%   Constitutional: Well developed, well nourished, no acute distress Eyes:  EOMI, conjunctiva normal bilaterally HENT: Normocephalic, atraumatic,mucus membranes moist Respiratory: Normal inspiratory effort Cardiovascular: Normal rate GI: nondistended skin: Hyperpigmented scaly rash with excoriations on bilateral dorsum of the  hands.      Musculoskeletal: Positive tenderness mid posterior right calf.  No appreciable swelling.  No popliteal tenderness or swelling,, palpable cord.  No tenderness over the Achilles tendon.  No other tenderness over the entire leg.  Right calf 44 cm, left calf 43.7 cm.  Trace pitting edema bilaterally.  Sensation, pulses distally intact. Neurologic: Alert & oriented x 3, no focal neuro deficits Psychiatric: Speech and behavior appropriate   ED Course   Medications - No data to display  Orders Placed This Encounter  Procedures  . D-dimer, quantitative (not at [facility])    Standing Status:   Standing    Number of Occurrences:   1  . Ambulatory  referral to Dermatology    Referral Priority:   Routine    Referral Type:   Consultation    Referral Reason:   Specialty Services Required    Requested Specialty:   Dermatology    Number of Visits Requested:   1  . AMB referral to sports medicine    Referral Priority:   Routine    Referral Type:   Consultation    Number of Visits Requested:   1    Results for orders placed or performed during the hospital encounter of 12/23/20 (from the past 24 hour(s))  D-dimer, quantitative (not at [facility])     Status: None   Collection Time: 12/23/20  8:20 PM  Result Value Ref Range   D-Dimer, Quant 0.45 0.00 - 0.50 ug/mL-FEU   No results found.  ED Clinical Impression  1. Right calf pain   2. Eczema, unspecified type      ED Assessment/Plan  1.  Calf pain.  Suspect muscle strain/overuse, however, she was states that her calf was bothering her about a week before her calf popped.  She has been working out which is new for her, however her Wells score is 1.   will get a D-dimer.  If positive, will order outpatient ultrasound, start on anticoagulation if necessary.  Will send home with Tylenol/ibuprofen, order referral to sports medicine for suspected calf strain/tear.  D-dimer negative.  Doubt DVT.  MyChart note sent.  2.  Eczema.  States that triamcinolone ointment does not work for her.  Will send home with betamethasone.  Advised short, lukewarm baths and showers, CeraVe lotion.  Follow-up with [facility] family practice dermatology clinic.  3.  Hypertension.  Patient normotensive.  Will not refill blood pressure medication at this point in time.  will provide primary care list for ongoing care and order assistance with finding a primary care provider.  Patient phone number [phone]   Discussed labs, MDM, treatment plan, and plan for follow-up with patient. Discussed sn/sx that should prompt return to the ED. patient agrees with plan.   Meds ordered this encounter  Medications  .  betamethasone dipropionate 0.05 % cream    Sig: Apply topically 2 (two) times daily.    Dispense:  30 g    Refill:  0  . ibuprofen (ADVIL) 600 MG tablet    Sig: Take 1 tablet (600 mg total) by mouth every 6 (six) hours as needed.    Dispense:  30 tablet    Refill:  0    *This clinic note was created using Scientist, clinical (histocompatibility and immunogenetics). Therefore, there may be occasional mistakes despite careful proofreading.   ?    Domenick Gong, MD 12/24/20 [phone]

## 2020-12-28 ENCOUNTER — Ambulatory Visit: Payer: Self-pay | Admitting: Internal Medicine

## 2021-01-03 ENCOUNTER — Ambulatory Visit: Payer: Self-pay | Admitting: Family Medicine

## 2021-01-19 ENCOUNTER — Ambulatory Visit: Payer: Self-pay | Admitting: Family Medicine

## 2021-02-08 ENCOUNTER — Telehealth: Payer: Self-pay | Admitting: Internal Medicine

## 2021-02-17 ENCOUNTER — Ambulatory Visit: Payer: Self-pay

## 2021-02-21 ENCOUNTER — Ambulatory Visit: Payer: Self-pay | Admitting: Internal Medicine

## 2021-04-05 NOTE — Progress Notes (Signed)
Virtual Visit via Telephone Note  I connected with Heather Farrell, on 04/06/2021 at 8:43 AM by telephone due to the COVID-19 pandemic and verified that I am speaking with the correct person using two identifiers.  Due to current restrictions/limitations of in-office visits due to the COVID-19 pandemic, this scheduled clinical appointment was converted to a telehealth visit.   Consent: I discussed the limitations, risks, security and privacy concerns of performing an evaluation and management service by telephone and the availability of in person appointments. I also discussed with the patient that there may be a patient responsible charge related to this service. The patient expressed understanding and agreed to proceed.   Location of Patient: Home  Location of Provider: [facility] at [facility]   Persons participating in Telemedicine visit: Heather Farrell Heather Stabs, NP Heather Farrell, CMA  History of Present Illness: Heather Farrell. Heather Farrell is a 35 year-old female who presents to establish care. PMH significant for hypertension and seasonal allergies.   Current issues and/or concerns: Concern for vaginal odor and body odor. Reports when she eats certain foods the odor lingers. History of bacterial vaginitis. In the past told to take probiotics after completing treatment for bacterial vaginitis and did not help much. Reports doesn't think the vaginal odor is related to possible sexually transmitted infections or urinary tract infection. However, she would still like sexually transmitted infections screening to be sure. Concern of pain during sexual intercourse. States when her partner hits her cervix she can taste spices. Requesting referral to Gynecology.   Past Medical History:  Diagnosis Date  . Hypertension   . Seasonal allergies    No Known Allergies  Current Outpatient Medications on File Prior to Visit  Medication Sig Dispense Refill  . acetaminophen (TYLENOL)  500 MG tablet Take 1,000 mg by mouth every 6 (six) hours as needed for headache (headache).     Marland Kitchen amLODipine (NORVASC) 5 MG tablet Take 1 tablet (5 mg total) by mouth daily. 30 tablet 0  . betamethasone dipropionate 0.05 % cream Apply topically 2 (two) times daily. 30 g 0  . ibuprofen (ADVIL) 600 MG tablet Take 1 tablet (600 mg total) by mouth every 6 (six) hours as needed. 30 tablet 0  . metroNIDAZOLE (FLAGYL) 500 MG tablet Take 1 tablet (500 mg total) by mouth 2 (two) times daily. 14 tablet 0  . [DISCONTINUED] cetirizine (ZYRTEC ALLERGY) 10 MG tablet Take 1 tablet (10 mg total) by mouth daily. 30 tablet 0   No current facility-administered medications on file prior to visit.    Observations/Objective: Alert and oriented x 3. Not in acute distress. Physical examination not completed as this is a telemedicine visit.  Assessment and Plan: 1. Encounter to establish care: - Patient presents today to establish care.  - Return for annual physical examination, labs, and health maintenance. Arrive fasting meaning having no food for at least 8 hours prior to appointment. You may have only water or black coffee. Please take scheduled medications as normal.  2. Vaginal odor: 3. Pain in female genitalia on intercourse: 4. Body odor: - Patient concern for vaginal odor and body odor. Reports when she eats certain foods the odor lingers. History of bacterial vaginitis. In the past told to take probiotics after completing treatment for bacterial vaginitis and did not help much. Concern of pain during sexual intercourse. States when her partner hits her cervix she can taste spices. Requesting referral to Gynecology.  - Counseled patient that possible sexually  transmitted infections or urinary tract infection may be related to vaginal odor. Patient not agreeable. However, reports she would still like sexually transmitted infections screening with Gynecology to be sure. - Per patient request referral to  Gynecology for further evaluation and management.  - Ambulatory referral to Gynecology   Follow Up Instructions: Return for annual physical exam.   Patient was given clear instructions to go to Emergency Department or return to medical center if symptoms don't improve, worsen, or new problems develop.The patient verbalized understanding.  I discussed the assessment and treatment plan with the patient. The patient was provided an opportunity to ask questions and all were answered. The patient agreed with the plan and demonstrated an understanding of the instructions.   The patient was advised to call back or seek an in-person evaluation if the symptoms worsen or if the condition fails to improve as anticipated.   I provided 10 minutes total of non-face-to-face time during this encounter.   [name], NP  [facility] at [facility] [place], [place] [phone] 04/06/2021, 8:43 AM

## 2021-04-06 ENCOUNTER — Telehealth (INDEPENDENT_AMBULATORY_CARE_PROVIDER_SITE_OTHER): Payer: 59 | Admitting: Family

## 2021-04-06 ENCOUNTER — Encounter: Payer: Self-pay | Admitting: Family

## 2021-04-06 ENCOUNTER — Other Ambulatory Visit: Payer: Self-pay

## 2021-04-06 DIAGNOSIS — N941 Unspecified dyspareunia: Secondary | ICD-10-CM | POA: Diagnosis not present

## 2021-04-06 DIAGNOSIS — Z7689 Persons encountering health services in other specified circumstances: Secondary | ICD-10-CM

## 2021-04-06 DIAGNOSIS — L750 Bromhidrosis: Secondary | ICD-10-CM

## 2021-04-06 DIAGNOSIS — N898 Other specified noninflammatory disorders of vagina: Secondary | ICD-10-CM | POA: Diagnosis not present

## 2021-04-06 NOTE — Progress Notes (Signed)
Establish care Pt states that she experiencing body odor, states when she eats certain foods it stays in system Needs referral to Gyn-when menstrual cycle is on bad vaginal odor

## 2021-04-07 ENCOUNTER — Telehealth: Payer: Self-pay | Admitting: Family

## 2021-04-07 NOTE — Telephone Encounter (Signed)
Pt does not want to travel to [place] for covid booster she will go to [employer]

## 2021-05-10 NOTE — Progress Notes (Signed)
Patient ID: Heather Farrell, female    DOB: [birth year]  MRN: [id number]  CC: Annual Physical Exam   Subjective: Heather Farrell is a 35 y.o. female who presents for annual physical exam.   Her concerns today include:   ANXIETY/DEPRESSION: Recently began a part time job. Getting paid every 2 weeks. Has financial concerns related to paying bills and being able to fford things for her children. She is a mother of two teenage children.   Initially drinking energy drinks when feeling depressed or when people were stressing her out. Usually drinking one energy drink daily but sometimes would have a multiple. The last time she had an energy drink was at least 1 month ago.  Sometimes unable to sleep at night. Will usually be on her phone watching videos at that time.   Concerns for food insecurity.  Reports she does have family/friends helping with food when able to do so. She had plans to apply for assistance for Social Services but hasn't been able to do so as of present.   Her mother passed away in 01-18-2012, sister passed away in January 17, 2019 related to being shot 9 times, and dad passed away in 01/18/2020.   Reports she was on anxiety medication in the past after delivery of her child. Reports medication seemed to help and would like to try again. Also, interested in counseling services.    Depression screen [facility] 2/9 05/11/2021 04/06/2021  Decreased Interest 0 0  Down, Depressed, Hopeless 1 0  PHQ - 2 Score 1 0  Altered sleeping 1 0  Tired, decreased energy 1 0  Change in appetite 0 0  Feeling bad or failure about yourself  0 0  Trouble concentrating 0 0  Moving slowly or fidgety/restless 0 0  Suicidal thoughts 0 0  PHQ-9 Score 3 0  Difficult doing work/chores Somewhat difficult Not difficult at all   2. HORMONE TESTING: Would like hormonal testing. Sometimes periods irregular. LMP early June 2022.    Current Outpatient Medications on File Prior to Visit  Medication Sig Dispense Refill   amLODipine  (NORVASC) 5 MG tablet Take 1 tablet (5 mg total) by mouth daily. 30 tablet 0   ibuprofen (ADVIL) 600 MG tablet Take 1 tablet (600 mg total) by mouth every 6 (six) hours as needed. 30 tablet 0   [DISCONTINUED] cetirizine (ZYRTEC ALLERGY) 10 MG tablet Take 1 tablet (10 mg total) by mouth daily. 30 tablet 0   No current facility-administered medications on file prior to visit.    No Known Allergies  Social History   Socioeconomic History   Marital status: Single    Spouse name: Not on file   Number of children: Not on file   Years of education: Not on file   Highest education level: Not on file  Occupational History   Not on file  Tobacco Use   Smoking status: Every Day    Packs/day: 1.50    Pack years: 0.00    Types: Cigarettes   Smokeless tobacco: Never  Vaping Use   Vaping Use: Never used  Substance and Sexual Activity   Alcohol use: Yes    Comment: occassionally    Drug use: Yes    Types: Marijuana   Sexual activity: Yes    Birth control/protection: Surgical  Other Topics Concern   Not on file  Social History Narrative   Not on file   Social Determinants of Health   Financial Resource Strain: Not on file  Food  Insecurity: Not on file  Transportation Needs: Not on file  Physical Activity: Not on file  Stress: Not on file  Social Connections: Not on file  Intimate Partner Violence: Not on file    Family History  Problem Relation Age of Onset   Cancer Other    Diabetes Other    Hypertension Other    Healthy Mother     Past Surgical History:  Procedure Laterality Date   TUBAL LIGATION      ROS: Review of Systems Negative except as stated above  PHYSICAL EXAM: BP 130/88 (BP Location: Left Arm, Patient Position: Sitting, Cuff Size: Normal)   Pulse 66   Temp 98.1 F (36.7 C)   Resp 16   Ht 5' 3.35" (1.609 m)   Wt 188 lb 3.2 oz (85.4 kg)   SpO2 98%   BMI 32.97 kg/m   Physical Exam Exam conducted with a chaperone present.  HENT:     Head:  Normocephalic and atraumatic.     Right Ear: Tympanic membrane, ear canal and external ear normal.     Left Ear: Tympanic membrane, ear canal and external ear normal.  Eyes:     Extraocular Movements: Extraocular movements intact.     Conjunctiva/sclera: Conjunctivae normal.     Pupils: Pupils are equal, round, and reactive to light.  Cardiovascular:     Rate and Rhythm: Normal rate and regular rhythm.     Pulses: Normal pulses.     Heart sounds: Normal heart sounds.  Pulmonary:     Effort: Pulmonary effort is normal.     Breath sounds: Normal breath sounds.  Chest:  Breasts:    Right: Normal.     Left: Normal.     Comments: Elmon Else, CMA present during examination.  Abdominal:     General: Bowel sounds are normal.     Palpations: Abdomen is soft.  Genitourinary:    General: Normal vulva.     Vagina: Normal.     Cervix: Normal.     Uterus: Normal.      Adnexa: Right adnexa normal and left adnexa normal.     Comments: Elmon Else, CMA present during examination.  Musculoskeletal:        General: Normal range of motion.     Cervical back: Normal range of motion and neck supple.  Skin:    General: Skin is warm and dry.     Capillary Refill: Capillary refill takes less than 2 seconds.  Neurological:     General: No focal deficit present.     Mental Status: She is alert and oriented to person, place, and time.  Psychiatric:        Mood and Affect: Mood normal.        Behavior: Behavior normal.    ASSESSMENT AND PLAN: 1. Annual physical exam: - Counseled on 150 minutes of exercise per week as tolerated, healthy eating (including decreased daily intake of saturated fats, cholesterol, added sugars, sodium), STI prevention, and routine healthcare maintenance.  2. Screening for metabolic disorder: - KKX38+HWEX to check kidney function, liver function, and electrolyte balance.  - CMP14+EGFR  3. Screening for deficiency anemia: - CBC to screen for anemia. -  CBC  4. Diabetes mellitus screening: - Hemoglobin A1c to screen for pre-diabetes/diabetes. - Hemoglobin A1c  5. Screening cholesterol level: - Lipid panel to screen for high cholesterol.  - Lipid panel  6. Thyroid disorder screen: - TSH to check thyroid function.  - TSH  7. Need for  hepatitis C screening test: - Hepatitis C antibody to screen for hepatitis C.  - Hepatitis C Antibody  8. Pap smear for cervical cancer screening: - Cytology - PAP to screen for cervical cancer.  - Cervicovaginal self-swab to screen for chlamydia, gonorrhea, trichomonas, bacterial vaginitis, and candida vaginitis. - Cytology - PAP([place]) - Cervicovaginal ancillary only  9. Hormone imbalance: - FSH and LH to screen hormone levels. - Follicle stimulating hormone - Luteinizing hormone  10. GAD (generalized anxiety disorder) 11. Depression, unspecified depression type: - Patient denies thoughts of self-harm, suicidal ideations, and homicidal ideations.  - Begin Sertraline as prescribed.  Avoid driving or hazardous activity until you know how this medication will affect you. Your reactions could be impaired. Dizziness or fainting can cause falls, accidents, or severe injuries. Common side effects include drowsiness, nausea, constipation, loss of appetite, dry mouth, increased sweating. Call your provider if you have pounding heartbeats or fluttering in your chest, a light-headed feeling like you may pass out, easy bruising/unusal bleeding, vision change, difficult or painful urination, impotence/sexual problems, liver problems (right-sided upper stomach pain, itching, dark urine, yellowing of skin or eyes/jaundice, low levels of sodium in the body (headache, confusion, slurred speech, severe weakness, vomiting, loss of coordination, feeling unsteady), or manic episodes (racing thoughts, increased energy, decreased need for sleep, risk-taking behavior, being agitated, talkative) Seek medical attention  immediately if you have symptoms of serotonin syndrome such as agitation, hallucinations, fever, sweating, shivering, fast heart rate, muscle stiffness, twitching, loss of coordination, nausea, vomiting, or diarrhea Report any new or worsening symptoms to your provider, such as but not limited to: mood or behavior changes, anxiety, panic attacks, trouble sleeping, or if you feel impulsive, irritable, agitated, hostile, aggressive, restless, hyperactive (mentally or physically), more depressed, or have thoughts about suicide or hurting yourself - Referral to Dennison Mascot, LCSW at [facility] and [facility] for counseling services.  - Follow-up with primary provider in 4 weeks or sooner if needed. - sertraline (ZOLOFT) 25 MG tablet; Take 1 tablet (25 mg total) by mouth at bedtime.  Dispense: 30 tablet; Refill: 0 - Ambulatory referral to Social Work  12. Food insecurity: - Referral to [employer], LCSW at [facility] and [facility] for community resources.  - Ambulatory referral to Social Work    Patient was given the opportunity to ask questions.  Patient verbalized understanding of the plan and was able to repeat key elements of the plan. Patient was given clear instructions to go to Emergency Department or return to medical center if symptoms don't improve, worsen, or new problems develop.The patient verbalized understanding.   Orders Placed This Encounter  Procedures   Hepatitis C Antibody   CBC   Lipid panel   TSH   Hemoglobin A1c   HUD14+HFWY   Follicle stimulating hormone   Luteinizing hormone   Ambulatory referral to Social Work     Requested Prescriptions   Signed Prescriptions Disp Refills   sertraline (ZOLOFT) 25 MG tablet 30 tablet 0    Sig: Take 1 tablet (25 mg total) by mouth at bedtime.    Return in about 4 weeks (around 06/08/2021) for Follow-Up depression and needs appt with Asante McCoy LCSW at [facility].  Camillia Herter, NP

## 2021-05-11 ENCOUNTER — Encounter: Payer: Self-pay | Admitting: Family

## 2021-05-11 ENCOUNTER — Other Ambulatory Visit (HOSPITAL_COMMUNITY)
Admission: RE | Admit: 2021-05-11 | Discharge: 2021-05-11 | Disposition: A | Discharge status: Home / Self Care | Payer: 59 | Source: Ambulatory Visit | Attending: Family | Admitting: Family

## 2021-05-11 ENCOUNTER — Ambulatory Visit (INDEPENDENT_AMBULATORY_CARE_PROVIDER_SITE_OTHER): Payer: 59 | Admitting: Family

## 2021-05-11 ENCOUNTER — Other Ambulatory Visit: Payer: Self-pay

## 2021-05-11 VITALS — BP 130/88 | HR 66 | Temp 98.1°F | Resp 16 | Ht 63.35 in | Wt 188.2 lb

## 2021-05-11 DIAGNOSIS — Z5941 Food insecurity: Secondary | ICD-10-CM

## 2021-05-11 DIAGNOSIS — F32A Depression, unspecified: Secondary | ICD-10-CM | POA: Diagnosis not present

## 2021-05-11 DIAGNOSIS — Z1159 Encounter for screening for other viral diseases: Secondary | ICD-10-CM

## 2021-05-11 DIAGNOSIS — Z13228 Encounter for screening for other metabolic disorders: Secondary | ICD-10-CM

## 2021-05-11 DIAGNOSIS — Z130 Encounter for screening for diseases of the blood and blood-forming organs and certain disorders involving the immune mechanism: Secondary | ICD-10-CM

## 2021-05-11 DIAGNOSIS — Z124 Encounter for screening for malignant neoplasm of cervix: Secondary | ICD-10-CM | POA: Diagnosis present

## 2021-05-11 DIAGNOSIS — E349 Endocrine disorder, unspecified: Secondary | ICD-10-CM | POA: Diagnosis not present

## 2021-05-11 DIAGNOSIS — Z1329 Encounter for screening for other suspected endocrine disorder: Secondary | ICD-10-CM

## 2021-05-11 DIAGNOSIS — Z13220 Encounter for screening for lipoid disorders: Secondary | ICD-10-CM

## 2021-05-11 DIAGNOSIS — Z131 Encounter for screening for diabetes mellitus: Secondary | ICD-10-CM

## 2021-05-11 DIAGNOSIS — F411 Generalized anxiety disorder: Secondary | ICD-10-CM | POA: Diagnosis not present

## 2021-05-11 DIAGNOSIS — Z0000 Encounter for general adult medical examination without abnormal findings: Secondary | ICD-10-CM | POA: Diagnosis not present

## 2021-05-11 MED ORDER — SERTRALINE HCL 25 MG PO TABS: 25.0000 mg | ORAL_TABLET | Freq: Every day | ORAL | 0 refills | Status: DC

## 2021-05-11 NOTE — Progress Notes (Signed)
Pt presents for annual exam w/pap, pt has been experiencing insomnia for quite some time and bouts of depression

## 2021-05-11 NOTE — Patient Instructions (Signed)
Preventive Care 21-37 Years Old, Female Preventive care refers to lifestyle choices and visits with your health care provider that can promote health and wellness. This includes: A yearly physical exam. This is also called an annual wellness visit. Regular dental and eye exams. Immunizations. Screening for certain conditions. Healthy lifestyle choices, such as: Eating a healthy diet. Getting regular exercise. Not using drugs or products that contain nicotine and tobacco. Limiting alcohol use. What can I expect for my preventive care visit? Physical exam Your health care provider may check your: Height and weight. These may be used to calculate your BMI (body mass index). BMI is a measurement that tells if you are at a healthy weight. Heart rate and blood pressure. Body temperature. Skin for abnormal spots. Counseling Your health care provider may ask you questions about your: Past medical problems. Family's medical history. Alcohol, tobacco, and drug use. Emotional well-being. Home life and relationship well-being. Sexual activity. Diet, exercise, and sleep habits. Work and work environment. Access to firearms. Method of birth control. Menstrual cycle. Pregnancy history. What immunizations do I need?  Vaccines are usually given at various ages, according to a schedule. Your health care provider will recommend vaccines for you based on your age, medicalhistory, and lifestyle or other factors, such as travel or where you work. What tests do I need?  Blood tests Lipid and cholesterol levels. These may be checked every 5 years starting at age 20. Hepatitis C test. Hepatitis B test. Screening Diabetes screening. This is done by checking your blood sugar (glucose) after you have not eaten for a while (fasting). STD (sexually transmitted disease) testing, if you are at risk. BRCA-related cancer screening. This may be done if you have a family history of breast, ovarian, tubal, or  peritoneal cancers. Pelvic exam and Pap test. This may be done every 3 years starting at age 21. Starting at age 30, this may be done every 5 years if you have a Pap test in combination with an HPV test. Talk with your health care provider about your test results, treatment options,and if necessary, the need for more tests. Follow these instructions at home: Eating and drinking  Eat a healthy diet that includes fresh fruits and vegetables, whole grains, lean protein, and low-fat dairy products. Take vitamin and mineral supplements as recommended by your health care provider. Do not drink alcohol if: Your health care provider tells you not to drink. You are pregnant, may be pregnant, or are planning to become pregnant. If you drink alcohol: Limit how much you have to 0-1 drink a day. Be aware of how much alcohol is in your drink. In the [place] one drink equals one 12 oz bottle of beer (355 mL), one 5 oz glass of wine (148 mL), or one 1 oz glass of hard liquor (44 mL).  Lifestyle Take daily care of your teeth and gums. Brush your teeth every morning and night with fluoride toothpaste. Floss one time each day. Stay active. Exercise for at least 30 minutes 5 or more days each week. Do not use any products that contain nicotine or tobacco, such as cigarettes, e-cigarettes, and chewing tobacco. If you need help quitting, ask your health care provider. Do not use drugs. If you are sexually active, practice safe sex. Use a condom or other form of protection to prevent STIs (sexually transmitted infections). If you do not wish to become pregnant, use a form of birth control. If you plan to become pregnant, see your health care   provider for a prepregnancy visit. Find healthy ways to cope with stress, such as: Meditation, yoga, or listening to music. Journaling. Talking to a trusted person. Spending time with friends and family. Safety Always wear your seat belt while driving or riding in a  vehicle. Do not drive: If you have been drinking alcohol. Do not ride with someone who has been drinking. When you are tired or distracted. While texting. Wear a helmet and other protective equipment during sports activities. If you have firearms in your house, make sure you follow all gun safety procedures. Seek help if you have been physically or sexually abused. What's next? Go to your health care provider once a year for an annual wellness visit. Ask your health care provider how often you should have your eyes and teeth checked. Stay up to date on all vaccines. This information is not intended to replace advice given to you by your health care provider. Make sure you discuss any questions you have with your healthcare provider. Document Revised: 06/27/2020 Document Reviewed: 07/11/2018 Elsevier Patient Education  2022 [employer].

## 2021-05-12 ENCOUNTER — Other Ambulatory Visit: Payer: Self-pay | Admitting: Family

## 2021-05-12 DIAGNOSIS — N760 Acute vaginitis: Secondary | ICD-10-CM | POA: Insufficient documentation

## 2021-05-12 LAB — CMP14+EGFR
ALT: 14 IU/L (ref 0–32)
AST: 13 IU/L (ref 0–40)
Albumin/Globulin Ratio: 1.7 (ref 1.2–2.2)
Albumin: 4.3 g/dL (ref 3.8–4.8)
Alkaline Phosphatase: 33 IU/L — ABNORMAL LOW (ref 44–121)
BUN/Creatinine Ratio: 10 (ref 9–23)
BUN: 8 mg/dL (ref 6–20)
Bilirubin Total: 0.3 mg/dL (ref 0.0–1.2)
CO2: 19 mmol/L — ABNORMAL LOW (ref 20–29)
Calcium: 9.2 mg/dL (ref 8.7–10.2)
Chloride: 102 mmol/L (ref 96–106)
Creatinine, Ser: 0.83 mg/dL (ref 0.57–1.00)
Globulin, Total: 2.5 g/dL (ref 1.5–4.5)
Glucose: 67 mg/dL (ref 65–99)
Potassium: 4.5 mmol/L (ref 3.5–5.2)
Sodium: 140 mmol/L (ref 134–144)
Total Protein: 6.8 g/dL (ref 6.0–8.5)
eGFR: 93 mL/min/{1.73_m2} (ref >59)

## 2021-05-12 LAB — CERVICOVAGINAL ANCILLARY ONLY
Bacterial Vaginitis (gardnerella): POSITIVE — AB
Candida Glabrata: NEGATIVE
Candida Vaginitis: NEGATIVE
Chlamydia: NEGATIVE
Comment: NEGATIVE
Comment: NEGATIVE
Comment: NEGATIVE
Comment: NEGATIVE
Comment: NEGATIVE
Comment: NORMAL
Neisseria Gonorrhea: NEGATIVE
Trichomonas: NEGATIVE

## 2021-05-12 LAB — CBC
Hematocrit: 43.1 % (ref 34.0–46.6)
Hemoglobin: 14.2 g/dL (ref 11.1–15.9)
MCH: 28.9 pg (ref 26.6–33.0)
MCHC: 32.9 g/dL (ref 31.5–35.7)
MCV: 88 fL (ref 79–97)
Platelets: 261 10*3/uL (ref 150–450)
RBC: 4.91 x10E6/uL (ref 3.77–5.28)
RDW: 13.2 % (ref 11.7–15.4)
WBC: 5.1 10*3/uL (ref 3.4–10.8)

## 2021-05-12 LAB — HEPATITIS C ANTIBODY: Hep C Virus Ab: 0.2 s/co ratio (ref 0.0–0.9)

## 2021-05-12 LAB — LIPID PANEL
Chol/HDL Ratio: 4.2 ratio (ref 0.0–4.4)
Cholesterol, Total: 179 mg/dL (ref 100–199)
HDL: 43 mg/dL (ref >39)
LDL Chol Calc (NIH): 113 mg/dL — ABNORMAL HIGH (ref 0–99)
Triglycerides: 129 mg/dL (ref 0–149)
VLDL Cholesterol Cal: 23 mg/dL (ref 5–40)

## 2021-05-12 LAB — HEMOGLOBIN A1C
Est. average glucose Bld gHb Est-mCnc: 108 mg/dL
Hgb A1c MFr Bld: 5.4 % (ref 4.8–5.6)

## 2021-05-12 LAB — LUTEINIZING HORMONE: LH: 4.2 m[IU]/mL

## 2021-05-12 LAB — TSH: TSH: 1.36 u[IU]/mL (ref 0.450–4.500)

## 2021-05-12 LAB — FOLLICLE STIMULATING HORMONE: FSH: 3.5 m[IU]/mL

## 2021-05-12 MED ORDER — METRONIDAZOLE 500 MG PO TABS: 500.0000 mg | ORAL_TABLET | Freq: Two times a day (BID) | ORAL | 0 refills | Status: AC

## 2021-05-12 NOTE — Progress Notes (Signed)
Negative Chlamydia, Gonorrhea, Trichomonas, and Candida Vaginitis (better known as yeast infection).  Positive for Bacterial Vaginitis, an overgrowth of normal bacteria in the vagina due to changes in pH often related to semen, menstrual periods, or soaps. Prescribed Flagyl twice per day for 7 days. Do not drink alcohol while taking this medication.

## 2021-05-12 NOTE — Progress Notes (Signed)
Kidney function normal.   Liver function normal.   Thyroid function normal.   FSH and LH hormones normal.   No diabetes.   No anemia.   Hepatitis C negative.   Cholesterol mildly higher than expected. High cholesterol may increase risk of heart attack and/or stroke. Consider eating more fruits, vegetables, and lean baked meats such as chicken or fish. Moderate intensity exercise at least 150 minutes as tolerated per week may help as well. However, patient is average risk and no medication needed at the moment. Encouraged to recheck in 6 months.

## 2021-05-17 ENCOUNTER — Encounter: Payer: 59 | Admitting: Clinical

## 2021-05-17 ENCOUNTER — Other Ambulatory Visit: Payer: Self-pay

## 2021-05-17 LAB — CYTOLOGY - PAP
Comment: NEGATIVE
Diagnosis: NEGATIVE
High risk HPV: NEGATIVE

## 2021-05-17 NOTE — Progress Notes (Signed)
PAP negative for lesion and malignancy.   HPV negative.   Repeat PAP smear in 3 years.

## 2021-05-19 ENCOUNTER — Encounter (INDEPENDENT_AMBULATORY_CARE_PROVIDER_SITE_OTHER): Payer: 59 | Admitting: Clinical

## 2021-06-09 ENCOUNTER — Other Ambulatory Visit: Payer: Self-pay

## 2021-06-09 ENCOUNTER — Emergency Department (HOSPITAL_COMMUNITY): Payer: 59

## 2021-06-09 ENCOUNTER — Emergency Department (HOSPITAL_COMMUNITY)
Admission: EM | Admit: 2021-06-09 | Discharge: 2021-06-09 | Disposition: A | Discharge status: Home / Self Care | Payer: 59 | Attending: Emergency Medicine | Admitting: Emergency Medicine

## 2021-06-09 DIAGNOSIS — R55 Syncope and collapse: Secondary | ICD-10-CM | POA: Diagnosis present

## 2021-06-09 DIAGNOSIS — E860 Dehydration: Secondary | ICD-10-CM | POA: Insufficient documentation

## 2021-06-09 DIAGNOSIS — F17210 Nicotine dependence, cigarettes, uncomplicated: Secondary | ICD-10-CM | POA: Diagnosis not present

## 2021-06-09 DIAGNOSIS — Z79899 Other long term (current) drug therapy: Secondary | ICD-10-CM | POA: Insufficient documentation

## 2021-06-09 DIAGNOSIS — R001 Bradycardia, unspecified: Secondary | ICD-10-CM | POA: Insufficient documentation

## 2021-06-09 DIAGNOSIS — E861 Hypovolemia: Secondary | ICD-10-CM | POA: Diagnosis not present

## 2021-06-09 DIAGNOSIS — I10 Essential (primary) hypertension: Secondary | ICD-10-CM | POA: Insufficient documentation

## 2021-06-09 DIAGNOSIS — R42 Dizziness and giddiness: Secondary | ICD-10-CM | POA: Diagnosis not present

## 2021-06-09 LAB — BASIC METABOLIC PANEL
Anion gap: 3 — ABNORMAL LOW (ref 5–15)
BUN: 8 mg/dL (ref 6–20)
CO2: 23 mmol/L (ref 22–32)
Calcium: 8.3 mg/dL — ABNORMAL LOW (ref 8.9–10.3)
Chloride: 109 mmol/L (ref 98–111)
Creatinine, Ser: 0.93 mg/dL (ref 0.44–1.00)
GFR, Estimated: >60 mL/min (ref >60)
Glucose, Bld: 98 mg/dL (ref 70–99)
Potassium: 3.6 mmol/L (ref 3.5–5.1)
Sodium: 135 mmol/L (ref 135–145)

## 2021-06-09 LAB — CBC WITH DIFFERENTIAL/PLATELET
Abs Immature Granulocytes: 0.04 10*3/uL (ref 0.00–0.07)
Basophils Absolute: 0 10*3/uL (ref 0.0–0.1)
Basophils Relative: 0 %
Eosinophils Absolute: 0 10*3/uL (ref 0.0–0.5)
Eosinophils Relative: 0 %
HCT: 44.3 % (ref 36.0–46.0)
Hemoglobin: 14.6 g/dL (ref 12.0–15.0)
Immature Granulocytes: 1 %
Lymphocytes Relative: 27 %
Lymphs Abs: 2 10*3/uL (ref 0.7–4.0)
MCH: 29.9 pg (ref 26.0–34.0)
MCHC: 33 g/dL (ref 30.0–36.0)
MCV: 90.6 fL (ref 80.0–100.0)
Monocytes Absolute: 0.5 10*3/uL (ref 0.1–1.0)
Monocytes Relative: 7 %
Neutro Abs: 5 10*3/uL (ref 1.7–7.7)
Neutrophils Relative %: 65 %
Platelets: 223 10*3/uL (ref 150–400)
RBC: 4.89 MIL/uL (ref 3.87–5.11)
RDW: 13.3 % (ref 11.5–15.5)
WBC: 7.7 10*3/uL (ref 4.0–10.5)
nRBC: 0 % (ref 0.0–0.2)

## 2021-06-09 LAB — MAGNESIUM: Magnesium: 1.6 mg/dL — ABNORMAL LOW (ref 1.7–2.4)

## 2021-06-09 LAB — I-STAT BETA HCG BLOOD, ED (MC, WL, AP ONLY): I-stat hCG, quantitative: <5 m[IU]/mL (ref <5)

## 2021-06-09 MED ORDER — MAGNESIUM SULFATE 2 GM/50ML IV SOLN
2.0000 g | Freq: Once | INTRAVENOUS | Status: AC
  Administered 2021-06-09: 2 g via INTRAVENOUS
  Filled 2021-06-09: qty 50

## 2021-06-09 MED ORDER — LACTATED RINGERS IV BOLUS
500.0000 mL | Freq: Once | INTRAVENOUS | Status: AC
  Administered 2021-06-09: 500 mL via INTRAVENOUS

## 2021-06-09 NOTE — ED Provider Notes (Signed)
[facility] EMERGENCY DEPARTMENT Provider Note   CSN: [id number] Arrival date & time: 06/09/21  1832     History Chief Complaint  Patient presents with   Near Syncope    Heather Farrell is a 35 y.o. female.   Near Syncope This is a new problem. The current episode started less than 1 hour ago. Pertinent negatives include no chest pain, no abdominal pain, no headaches and no shortness of breath.   35 year old [place].  The patient donated plasma earlier today and felt lightheaded while walking to the bus stop.  EMS found her to be sinus bradycardia with heart rate in the 50s.  She was administered 0.5 mg of atropine and 1 L of normal saline.  Initial blood pressure 76/40, blood pressure 107/78 after IV fluids.  CBG was 110.  Since fluid administration, the patient states that she feels much better.  She arrived she ABC intact, GCS 15, hemodynamically stable.  Past Medical History:  Diagnosis Date   Hypertension    Seasonal allergies     Patient Active Problem List   Diagnosis Date Noted   Bacterial vaginitis 05/12/2021   Depression 05/11/2021   GAD (generalized anxiety disorder) 05/11/2021    Past Surgical History:  Procedure Laterality Date   TUBAL LIGATION       OB History   No obstetric history on file.     Family History  Problem Relation Age of Onset   Cancer Other    Diabetes Other    Hypertension Other    Healthy Mother     Social History   Tobacco Use   Smoking status: Every Day    Packs/day: 1.50    Types: Cigarettes   Smokeless tobacco: Never  Vaping Use   Vaping Use: Never used  Substance Use Topics   Alcohol use: Yes    Comment: occassionally    Drug use: Yes    Types: Marijuana    Home Medications Prior to Admission medications   Medication Sig Start Date End Date Taking? Authorizing Provider  Multiple Vitamins-Minerals (MULTIVITAMIN ADULTS PO) Take 1 tablet by  mouth 2 (two) times a week.   Yes [provider]  amLODipine (NORVASC) 5 MG tablet Take 1 tablet (5 mg total) by mouth daily. Patient not taking: Reported on 06/09/2021 09/01/19   Mickie Bail, NP  ibuprofen (ADVIL) 600 MG tablet Take 1 tablet (600 mg total) by mouth every 6 (six) hours as needed. Patient not taking: Reported on 06/09/2021 12/23/20   Domenick Gong, MD  sertraline (ZOLOFT) 25 MG tablet TAKE 1 TABLET(25 MG) BY MOUTH AT BEDTIME 06/10/21   [name], NP  cetirizine (ZYRTEC ALLERGY) 10 MG tablet Take 1 tablet (10 mg total) by mouth daily. 04/05/20 12/23/20  Moshe Cipro, NP    Allergies    Onion  Review of Systems   Review of Systems  Constitutional:  Negative for chills and fever.  HENT:  Negative for ear pain and sore throat.   Eyes:  Negative for pain and visual disturbance.  Respiratory:  Negative for cough and shortness of breath.   Cardiovascular:  Positive for near-syncope. Negative for chest pain and palpitations.  Gastrointestinal:  Negative for abdominal pain and vomiting.  Genitourinary:  Negative for dysuria and hematuria.  Musculoskeletal:  Negative for arthralgias and back pain.  Skin:  Negative for color change and rash.  Neurological:  Positive for light-headedness. Negative for  seizures, syncope and headaches.  All other systems reviewed and are negative.  Physical Exam Updated Vital Signs BP 107/69   Pulse (!) 51   Temp 98.1 F (36.7 C) (Oral)   Resp 16   Ht 5\' 2"  (1.575 m)   Wt 87.1 kg   SpO2 100%   BMI 35.12 kg/m   Physical Exam Vitals and nursing note reviewed.  Constitutional:      General: She is not in acute distress.    Appearance: She is well-developed.  HENT:     Head: Normocephalic and atraumatic.  Eyes:     Conjunctiva/sclera: Conjunctivae normal.  Cardiovascular:     Rate and Rhythm: Regular rhythm. Bradycardia present.     Pulses: Normal pulses.     Heart sounds: No murmur heard. Pulmonary:      Effort: Pulmonary effort is normal. No respiratory distress.     Breath sounds: Normal breath sounds.  Abdominal:     Palpations: Abdomen is soft.     Tenderness: There is no abdominal tenderness.  Musculoskeletal:     Cervical back: Neck supple.  Skin:    General: Skin is warm and dry.  Neurological:     General: No focal deficit present.     Mental Status: She is alert and oriented to person, place, and time. Mental status is at baseline.     Cranial Nerves: No cranial nerve deficit.     Sensory: No sensory deficit.     Motor: No weakness.     Coordination: Coordination normal.    ED Results / Procedures / Treatments   Labs (all labs ordered are listed, but only abnormal results are displayed) Labs Reviewed  BASIC METABOLIC PANEL - Abnormal; Notable for the following components:      Result Value   Calcium 8.3 (*)    Anion gap 3 (*)    All other components within normal limits  MAGNESIUM - Abnormal; Notable for the following components:   Magnesium 1.6 (*)    All other components within normal limits  CBC WITH DIFFERENTIAL/PLATELET  I-STAT BETA HCG BLOOD, ED ([facility], [facility], [facility] ONLY)    EKG EKG Interpretation  Date/Time:  Thursday June 09 2021 18:38:40 EDT Ventricular Rate:  63 PR Interval:  161 QRS Duration: 88 QT Interval:  381 QTC Calculation: 390 R Axis:   93 Text Interpretation: Sinus rhythm Consider left atrial enlargement Borderline right axis deviation Confirmed by [id number] ([id number]) on 06/09/2021 7:47:18 PM  Radiology DG Chest Portable 1 View  Result Date: 06/09/2021 CLINICAL DATA:  Near syncope. EXAM: PORTABLE CHEST 1 VIEW COMPARISON:  January 05, 2009. FINDINGS: The heart size and mediastinal contours are within normal limits. Both lungs are clear. The visualized skeletal structures are unremarkable. IMPRESSION: No active disease. Electronically Signed   By: January 07, 2009 M.D.   On: 06/09/2021 19:38    Procedures Procedures   Medications Ordered in  ED Medications  lactated ringers bolus 500 mL (0 mLs Intravenous Stopped 06/09/21 2015)  magnesium sulfate IVPB 2 g 50 mL (0 g Intravenous Stopped 06/09/21 2249)    ED Course  I have reviewed the triage vital signs and the nursing notes.  Pertinent labs & imaging results that were available during my care of the patient were reviewed by me and considered in my medical decision making (see chart for details).    MDM Rules/Calculators/A&P  35 year old female presenting to the emergency department with near syncopal episode and lightheadedness with bradycardia noted.  Symptoms began after donating plasma earlier this afternoon.  The patient was found to be hypotensive with EMS which subsequently improved after administration of 1 L normal saline.  She was administered 2.5 mg of atropine due to sinus bradycardia in the 50s in the setting of hypotension.  Symptoms have since completely resolved.  On arrival, the patient was mildly bradycardic with a heart rate in the 50s, normotensive, with no complaints.  EKG revealed sinus bradycardia with no other evidence of block.  Patient's magnesium was found to be low and this was replenished IV.  She was administered an additional 500 cc of IV fluid for volume resuscitation.  The patient's hypotension resolved with IV fluid resuscitation.  She states that she is no longer symptomatic.  She remains persistently bradycardic but at this time without symptoms I do not believe she needs acute intervention at this time.  No evidence of heart block on EKG.  Recommended the patient follow-up with her PCP regarding her bradycardia. Final Clinical Impression(s) / ED Diagnoses Final diagnoses:  Near syncope  Dehydration  Hypovolemia  Bradycardia  Hypomagnesemia    Rx / DC Orders ED Discharge Orders     None        Ernie Avena, MD 06/10/21 1756

## 2021-06-09 NOTE — ED Triage Notes (Signed)
[place] [employer] [place] d/t near syncope. Per ems, pt donated plasma this afternoon, and felt dizzy while walking to the bus station. Sinus brady with ems in the 50s. 0.5 mg atropine and 1L of NS with EMS. A&O * 4.   Initial BP: 76/40 BP: 107/78 after fluids  Cbg 110

## 2021-06-10 ENCOUNTER — Other Ambulatory Visit: Payer: Self-pay | Admitting: Family

## 2021-06-10 DIAGNOSIS — F32A Depression, unspecified: Secondary | ICD-10-CM

## 2021-06-13 ENCOUNTER — Telehealth: Payer: Self-pay | Admitting: Family

## 2021-06-13 NOTE — Telephone Encounter (Signed)
.  [name], guest are scheduled for a virtual visit with your provider today.    Just as we do with appointments in the office, we must obtain your consent to participate.  Your consent will be active for this visit and any virtual visit you may have with one of our providers in the next 365 days.    If you have a MyChart account, I can also send a copy of this consent to you electronically.  All virtual visits are billed to your insurance company just like a traditional visit in the office.  As this is a virtual visit, video technology does not allow for your provider to perform a traditional examination.  This may limit your provider's ability to fully assess your condition.  If your provider identifies any concerns that need to be evaluated in person or the need to arrange testing such as labs, EKG, etc, we will make arrangements to do so.    Although advances in technology are sophisticated, we cannot ensure that it will always work on either your end or our end.  If the connection with a video visit is poor, we may have to switch to a telephone visit.  With either a video or telephone visit, we are not always able to ensure that we have a secure connection.   I need to obtain your verbal consent now.   Are you willing to proceed with your visit today?   Heather Farrell has provided verbal consent on 06/13/2021 for a virtual visit (video or telephone).   [name] 06/13/2021  4:53 PM

## 2021-06-13 NOTE — Progress Notes (Signed)
Patient did not show for appointment.   

## 2021-06-14 ENCOUNTER — Encounter: Payer: 59 | Admitting: Family

## 2021-06-14 ENCOUNTER — Other Ambulatory Visit: Payer: Self-pay

## 2021-06-14 DIAGNOSIS — F32A Depression, unspecified: Secondary | ICD-10-CM

## 2021-06-14 DIAGNOSIS — F411 Generalized anxiety disorder: Secondary | ICD-10-CM

## 2021-06-16 ENCOUNTER — Encounter: Payer: 59 | Admitting: Obstetrics and Gynecology

## 2021-07-01 ENCOUNTER — Encounter (HOSPITAL_COMMUNITY): Payer: Self-pay

## 2021-07-01 ENCOUNTER — Other Ambulatory Visit: Payer: Self-pay

## 2021-07-01 ENCOUNTER — Ambulatory Visit (HOSPITAL_COMMUNITY)
Admission: EM | Admit: 2021-07-01 | Discharge: 2021-07-01 | Disposition: A | Discharge status: Home / Self Care | Payer: 59 | Attending: Medical Oncology | Admitting: Medical Oncology

## 2021-07-01 DIAGNOSIS — Z20822 Contact with and (suspected) exposure to covid-19: Secondary | ICD-10-CM | POA: Insufficient documentation

## 2021-07-01 DIAGNOSIS — R059 Cough, unspecified: Secondary | ICD-10-CM | POA: Diagnosis present

## 2021-07-01 LAB — SARS CORONAVIRUS 2 (TAT 6-24 HRS): SARS Coronavirus 2: NEGATIVE

## 2021-07-01 NOTE — ED Provider Notes (Signed)
[facility]    CSN: [id number] Arrival date & time: 07/01/21  1541      History   Chief Complaint Chief Complaint  Patient presents with   Cough    HPI Heather Farrell is a 35 y.o. female.   HPI  Cough: Pt presents with a cough that they have had for about 1 week. She requests a COVID-19 test.  She states that on the fourth of this month her and her family went to go get tested for COVID-19 as she had a mild cough, her daughter had a headache and her other daughter lost her taste and smell.  She reports that it took about a week to get the results back and the results were positive.  She states that there was a questionable lab error with the results and so she would like to be retested today.  She states that her cough has almost fully resolved and is dry in nature.  No shortness of breath, chest pain, hemoptysis or productive cough..   Past Medical History:  Diagnosis Date   Hypertension    Seasonal allergies     Patient Active Problem List   Diagnosis Date Noted   Bacterial vaginitis 05/12/2021   Depression 05/11/2021   GAD (generalized anxiety disorder) 05/11/2021    Past Surgical History:  Procedure Laterality Date   TUBAL LIGATION      OB History   No obstetric history on file.      Home Medications    Prior to Admission medications   Medication Sig Start Date End Date Taking? Authorizing Provider  amLODipine (NORVASC) 5 MG tablet Take 1 tablet (5 mg total) by mouth daily. Patient not taking: Reported on 06/09/2021 09/01/19   Mickie Bail, NP  ibuprofen (ADVIL) 600 MG tablet Take 1 tablet (600 mg total) by mouth every 6 (six) hours as needed. Patient not taking: Reported on 06/09/2021 12/23/20   Domenick Gong, MD  Multiple Vitamins-Minerals (MULTIVITAMIN ADULTS PO) Take 1 tablet by mouth 2 (two) times a week.    [provider]  sertraline (ZOLOFT) 25 MG tablet TAKE 1 TABLET(25 MG) BY MOUTH AT BEDTIME 06/10/21   [name], NP   cetirizine (ZYRTEC ALLERGY) 10 MG tablet Take 1 tablet (10 mg total) by mouth daily. 04/05/20 12/23/20  Moshe Cipro, NP    Family History Family History  Problem Relation Age of Onset   Cancer Other    Diabetes Other    Hypertension Other    Healthy Mother     Social History Social History   Tobacco Use   Smoking status: Every Day    Packs/day: 1.50    Types: Cigarettes   Smokeless tobacco: Never  Vaping Use   Vaping Use: Never used  Substance Use Topics   Alcohol use: Yes    Comment: occassionally    Drug use: Yes    Types: Marijuana     Allergies   Onion   Review of Systems Review of Systems  As stated above in HPI Physical Exam Triage Vital Signs ED Triage Vitals  Enc Vitals Group     BP 07/01/21 1657 123/80     Pulse Rate 07/01/21 1657 (!) 58     Resp 07/01/21 1657 18     Temp 07/01/21 1657 98.1 F (36.7 C)     Temp Source 07/01/21 1657 Oral     SpO2 07/01/21 1657 100 %     Weight --  Height --      Head Circumference --      Peak Flow --      Pain Score 07/01/21 1656 0     Pain Loc --      Pain Edu? --      Excl. in GC? --    No data found.  Updated Vital Signs BP 123/80 (BP Location: Right Arm)   Pulse (!) 58   Temp 98.1 F (36.7 C) (Oral)   Resp 18   LMP 06/18/2021 (Approximate)   SpO2 100%   Physical Exam Vitals and nursing note reviewed.  Constitutional:      General: She is not in acute distress.    Appearance: Normal appearance. She is not ill-appearing, toxic-appearing or diaphoretic.  HENT:     Head: Normocephalic and atraumatic.     Nose: Nose normal. No congestion or rhinorrhea.     Mouth/Throat:     Mouth: Mucous membranes are moist.     Pharynx: Oropharynx is clear. No oropharyngeal exudate or posterior oropharyngeal erythema.  Eyes:     Extraocular Movements: Extraocular movements intact.     Pupils: Pupils are equal, round, and reactive to light.  Cardiovascular:     Rate and Rhythm: Normal rate and  regular rhythm.     Pulses: Normal pulses.     Heart sounds: Normal heart sounds.  Pulmonary:     Effort: Pulmonary effort is normal.     Breath sounds: Normal breath sounds.  Skin:    General: Skin is warm.  Neurological:     Mental Status: She is alert and oriented to person, place, and time.     UC Treatments / Results  Labs (all labs ordered are listed, but only abnormal results are displayed) Labs Reviewed - No data to display  EKG   Radiology No results found.  Procedures Procedures (including critical care time)  Medications Ordered in UC Medications - No data to display  Initial Impression / Assessment and Plan / UC Course  I have reviewed the triage vital signs and the nursing notes.  Pertinent labs & imaging results that were available during my care of the patient were reviewed by me and considered in my medical decision making (see chart for details).     New.  Retesting for COVID-19.  Discussed Tessalon Perles which she would like to have to use as needed.  Discussed red flag signs and symptoms. Final Clinical Impressions(s) / UC Diagnoses   Final diagnoses:  None   Discharge Instructions   None    ED Prescriptions   None    PDMP not reviewed this encounter.   Rushie Chestnut, [place] 07/01/21 1732

## 2021-07-01 NOTE — ED Triage Notes (Signed)
Pt requesting to have Covid test done, states she has a cough. Started: last week

## 2022-02-28 ENCOUNTER — Encounter (HOSPITAL_COMMUNITY): Payer: Self-pay

## 2022-02-28 ENCOUNTER — Ambulatory Visit (HOSPITAL_COMMUNITY)
Admission: EM | Admit: 2022-02-28 | Discharge: 2022-02-28 | Disposition: A | Discharge status: Home / Self Care | Payer: Self-pay | Attending: Family Medicine | Admitting: Family Medicine

## 2022-02-28 DIAGNOSIS — N898 Other specified noninflammatory disorders of vagina: Secondary | ICD-10-CM | POA: Insufficient documentation

## 2022-02-28 DIAGNOSIS — Z113 Encounter for screening for infections with a predominantly sexual mode of transmission: Secondary | ICD-10-CM | POA: Insufficient documentation

## 2022-02-28 DIAGNOSIS — R1013 Epigastric pain: Secondary | ICD-10-CM | POA: Insufficient documentation

## 2022-02-28 DIAGNOSIS — F32A Depression, unspecified: Secondary | ICD-10-CM | POA: Insufficient documentation

## 2022-02-28 LAB — POCT URINALYSIS DIPSTICK, ED / UC
Bilirubin Urine: NEGATIVE
Glucose, UA: NEGATIVE mg/dL
Hgb urine dipstick: NEGATIVE
Ketones, ur: NEGATIVE mg/dL
Leukocytes,Ua: NEGATIVE
Nitrite: NEGATIVE
Protein, ur: NEGATIVE mg/dL
Specific Gravity, Urine: <=1.005 (ref 1.005–1.030)
Urobilinogen, UA: 0.2 mg/dL (ref 0.0–1.0)
pH: 5.5 (ref 5.0–8.0)

## 2022-02-28 LAB — POC URINE PREG, ED: Preg Test, Ur: NEGATIVE

## 2022-02-28 MED ORDER — FLUCONAZOLE 150 MG PO TABS: ORAL_TABLET | ORAL | 0 refills | Status: DC

## 2022-02-28 MED ORDER — SERTRALINE HCL 25 MG PO TABS: ORAL_TABLET | ORAL | 1 refills | Status: DC

## 2022-02-28 MED ORDER — PANTOPRAZOLE SODIUM 20 MG PO TBEC: 20.0000 mg | DELAYED_RELEASE_TABLET | Freq: Every day | ORAL | 1 refills | Status: DC

## 2022-02-28 MED ORDER — METRONIDAZOLE 500 MG PO TABS: 500.0000 mg | ORAL_TABLET | Freq: Two times a day (BID) | ORAL | 0 refills | Status: DC

## 2022-02-28 NOTE — Discharge Instructions (Signed)
We have sent testing for sexually transmitted infections. We will notify you of any positive results once they are received. If required, we will prescribe any medications you might need.  Please refrain from all sexual activity for at least the next seven days.  

## 2022-02-28 NOTE — ED Triage Notes (Signed)
Pt c/o abdominal pain x4-5 days, hx of constipation. C/o vomiting since yesterday. States needs a work note.  ? ?Pt c/o pain in cervix after having sex. Denies any other sx's. ?

## 2022-03-01 LAB — CERVICOVAGINAL ANCILLARY ONLY
Bacterial Vaginitis (gardnerella): POSITIVE — AB
Candida Glabrata: NEGATIVE
Candida Vaginitis: NEGATIVE
Chlamydia: NEGATIVE
Comment: NEGATIVE
Comment: NEGATIVE
Comment: NEGATIVE
Comment: NEGATIVE
Comment: NEGATIVE
Comment: NORMAL
Neisseria Gonorrhea: NEGATIVE
Trichomonas: NEGATIVE

## 2022-03-01 NOTE — ED Provider Notes (Signed)
?[facility] ? ? ?[id number] ?02/28/22 Arrival Time: 1539 ? ?ASSESSMENT & PLAN: ? ?1. Dyspepsia   ?2. Depression, unspecified depression type   ?3. Vaginal discharge   ?4. Screening for STDs (sexually transmitted diseases)   ? ?Benign abdominal exam. No indications for urgent abdominal/pelvic imaging at this time. Suspect GERD. Discussed. ? ?Meds ordered this encounter  ?Medications  ? sertraline (ZOLOFT) 25 MG tablet  ?  Sig: TAKE 1 TABLET(25 MG) BY MOUTH AT BEDTIME  ?  Dispense:  30 tablet  ?  Refill:  1  ? metroNIDAZOLE (FLAGYL) 500 MG tablet  ?  Sig: Take 1 tablet (500 mg total) by mouth 2 (two) times daily.  ?  Dispense:  14 tablet  ?  Refill:  0  ? fluconazole (DIFLUCAN) 150 MG tablet  ?  Sig: Take one tablet by mouth as a single dose. May repeat in 3 days if symptoms persist.  ?  Dispense:  2 tablet  ?  Refill:  0  ? pantoprazole (PROTONIX) 20 MG tablet  ?  Sig: Take 1 tablet (20 mg total) by mouth daily.  ?  Dispense:  30 tablet  ?  Refill:  1  ? ?Vaginal cytology pending. Will tx empirically for yeast/BV. ? ? ? ?Discharge Instructions   ? ?  ?We have sent testing for sexually transmitted infections. We will notify you of any positive results once they are received. If required, we will prescribe any medications you might need. ? ?Please refrain from all sexual activity for at least the next seven days. ? ? ? ? Follow-up Information   ? ? Schedule an appointment as soon as possible for a visit  with [name], NP.   ?Specialty: Nurse Practitioner ?Contact information: ?[address] ?[address] ?[place] [address] ?[phone] ? ? ?  ?  ? ?  ?  ? ?  ? ? ?Reviewed expectations re: course of current medical issues. Questions answered. ?Outlined signs and symptoms indicating need for more acute intervention. ?Patient verbalized understanding. ?After Visit Summary given. ? ? ?SUBJECTIVE: ?History from: patient. ?Heather Farrell is a 36 y.o. female who presents with complaint of generalized abd  discomfort; over past week; mainly epigastric upon further questioning. Is related to eating; feels bloated at times. Emesis yesterday; mild nausea after eating. Also with vaginal discharge; h/o BV/yeast. No concern over STI.  ?Also with recurrent depression. Feels like this is returning; financial concerns. No SI. Would like to restart Zoloft. ? ?Patient's last menstrual period was 02/11/2022. ? ?Past Surgical History:  ?Procedure Laterality Date  ? TUBAL LIGATION    ? ? ? ?OBJECTIVE: ? ?Vitals:  ? 02/28/22 1641  ?BP: (!) 155/105  ?Pulse: 62  ?Resp: 18  ?Temp: (!) 97.5 ?F (36.4 ?C)  ?TempSrc: Oral  ?SpO2: 98%  ?  ?General appearance: alert, oriented, no acute distress ?HEENT: [place]; AT; oropharynx moist ?Lungs: unlabored respirations ?Abdomen: soft; without distention; no specific tenderness to palpation; normal bowel sounds; without masses or organomegaly; without guarding or rebound tenderness ?Back: without reported CVA tenderness; FROM at waist ?GU: deferred ?Extremities: without LE edema; symmetrical; without gross deformities ?Skin: warm and dry ?Neurologic: normal gait ?Psychological: alert and cooperative; normal mood and affect ? ?Labs: ?Results for orders placed or performed during the hospital encounter of 02/28/22  ?POC Urinalysis dipstick  ?Result Value Ref Range  ? Glucose, UA NEGATIVE NEGATIVE mg/dL  ? Bilirubin Urine NEGATIVE NEGATIVE  ? Ketones, ur NEGATIVE NEGATIVE mg/dL  ?  Specific Gravity, Urine <=1.005 1.005 - 1.030  ? Hgb urine dipstick NEGATIVE NEGATIVE  ? pH 5.5 5.0 - 8.0  ? Protein, ur NEGATIVE NEGATIVE mg/dL  ? Urobilinogen, UA 0.2 0.0 - 1.0 mg/dL  ? Nitrite NEGATIVE NEGATIVE  ? Leukocytes,Ua NEGATIVE NEGATIVE  ?POC urine pregnancy  ?Result Value Ref Range  ? Preg Test, Ur NEGATIVE NEGATIVE  ? ?Labs Reviewed  ?POCT URINALYSIS DIPSTICK, ED / UC  ?POC URINE PREG, ED  ?CERVICOVAGINAL ANCILLARY ONLY  ? ? ?Allergies  ?Allergen Reactions  ? Onion Anaphylaxis  ? ?                                             ?Past Medical History:  ?Diagnosis Date  ? Hypertension   ? Seasonal allergies   ? ? ?Social History  ? ?Socioeconomic History  ? Marital status: Single  ?  Spouse name: Not on file  ? Number of children: Not on file  ? Years of education: Not on file  ? Highest education level: Not on file  ?Occupational History  ? Not on file  ?Tobacco Use  ? Smoking status: Every Day  ?  Packs/day: 1.50  ?  Types: Cigarettes  ? Smokeless tobacco: Never  ?Vaping Use  ? Vaping Use: Never used  ?Substance and Sexual Activity  ? Alcohol use: Yes  ?  Comment: occassionally   ? Drug use: Yes  ?  Types: Marijuana  ? Sexual activity: Yes  ?  Birth control/protection: Surgical  ?Other Topics Concern  ? Not on file  ?Social History Narrative  ? Not on file  ? ?Social Determinants of Health  ? ?Financial Resource Strain: Not on file  ?Food Insecurity: Not on file  ?Transportation Needs: Not on file  ?Physical Activity: Not on file  ?Stress: Not on file  ?Social Connections: Not on file  ?Intimate Partner Violence: Not on file  ? ? ?Family History  ?Problem Relation Age of Onset  ? Cancer Other   ? Diabetes Other   ? Hypertension Other   ? Healthy Mother   ? ?  ?Mardella Layman, MD ?03/01/22 1104 ? ?

## 2022-07-09 ENCOUNTER — Encounter (HOSPITAL_COMMUNITY): Payer: Self-pay | Admitting: Emergency Medicine

## 2022-07-09 ENCOUNTER — Ambulatory Visit (HOSPITAL_COMMUNITY)
Admission: EM | Admit: 2022-07-09 | Discharge: 2022-07-09 | Disposition: A | Discharge status: Home / Self Care | Payer: Self-pay | Attending: Emergency Medicine | Admitting: Emergency Medicine

## 2022-07-09 DIAGNOSIS — Z91018 Allergy to other foods: Secondary | ICD-10-CM

## 2022-07-09 DIAGNOSIS — L509 Urticaria, unspecified: Secondary | ICD-10-CM

## 2022-07-09 DIAGNOSIS — L299 Pruritus, unspecified: Secondary | ICD-10-CM

## 2022-07-09 MED ORDER — FAMOTIDINE 20 MG PO TABS: 20.0000 mg | ORAL_TABLET | Freq: Every evening | ORAL | 2 refills | Status: AC

## 2022-07-09 MED ORDER — CETIRIZINE HCL 10 MG PO TABS: 10.0000 mg | ORAL_TABLET | Freq: Every day | ORAL | 2 refills | Status: AC

## 2022-07-09 NOTE — Discharge Instructions (Addendum)
I recommend taking the Zyrtec daily and the Pepcid nightly for the next week or so.  You can add Benadryl at nighttime as well.  The combination of these medicines should help with your itching and hives.  Please go to the emergency department if symptoms worsen.

## 2022-07-09 NOTE — ED Provider Notes (Signed)
[facility]    CSN: [id number] Arrival date & time: 07/09/22  1448      History   Chief Complaint Rash   HPI Heather Farrell is a 36 y.o. female.  Presents with 5-day history of itching. Reports she was at work and someone was cutting onions.  She gets hives and itching if she smells or touches onions. Has been trying Benadryl without relief of symptoms.  Denies any swelling of the lips or tongue, sensation of the throat swelling, shortness of breath or trouble breathing, chest pain, abdominal pain, nausea, vomiting/diarrhea.  Past Medical History:  Diagnosis Date   Hypertension    Seasonal allergies     Patient Active Problem List   Diagnosis Date Noted   Bacterial vaginitis 05/12/2021   Depression 05/11/2021   GAD (generalized anxiety disorder) 05/11/2021    Past Surgical History:  Procedure Laterality Date   TUBAL LIGATION      OB History   No obstetric history on file.      Home Medications    Prior to Admission medications   Medication Sig Start Date End Date Taking? Authorizing Provider  cetirizine (ZYRTEC ALLERGY) 10 MG tablet Take 1 tablet (10 mg total) by mouth daily. 07/09/22  Yes Yaroslav Gombos, Lurena Joiner, PA-C  famotidine (PEPCID) 20 MG tablet Take 1 tablet (20 mg total) by mouth at bedtime. 07/09/22  Yes Nashton Belson, Lurena Joiner, PA-C  amLODipine (NORVASC) 5 MG tablet Take 1 tablet (5 mg total) by mouth daily. Patient not taking: Reported on 06/09/2021 09/01/19   Mickie Bail, NP  fluconazole (DIFLUCAN) 150 MG tablet Take one tablet by mouth as a single dose. May repeat in 3 days if symptoms persist. 02/28/22   Mardella Layman, MD  metroNIDAZOLE (FLAGYL) 500 MG tablet Take 1 tablet (500 mg total) by mouth 2 (two) times daily. 02/28/22   Mardella Layman, MD  Multiple Vitamins-Minerals (MULTIVITAMIN ADULTS PO) Take 1 tablet by mouth 2 (two) times a week.    [provider]  sertraline (ZOLOFT) 25 MG tablet TAKE 1 TABLET(25 MG) BY MOUTH AT BEDTIME 02/28/22    Mardella Layman, MD    Family History Family History  Problem Relation Age of Onset   Healthy Mother    Cancer Other    Diabetes Other    Hypertension Other     Social History Social History   Tobacco Use   Smoking status: Every Day    Packs/day: 1.50    Types: Cigarettes   Smokeless tobacco: Never  Vaping Use   Vaping Use: Never used  Substance Use Topics   Alcohol use: Yes    Comment: occassionally    Drug use: Yes    Types: Marijuana     Allergies   Onion   Review of Systems Review of Systems Per HPI  Physical Exam Triage Vital Signs ED Triage Vitals  Enc Vitals Group     BP 07/09/22 1534 (!) 146/101     Pulse Rate 07/09/22 1534 76     Resp 07/09/22 1534 16     Temp 07/09/22 1534 98.5 F (36.9 C)     Temp Source 07/09/22 1534 Oral     SpO2 07/09/22 1534 100 %     Weight --      Height --      Head Circumference --      Peak Flow --      Pain Score 07/09/22 1535 0     Pain Loc --  Pain Edu? --      Excl. in GC? --    No data found.  Updated Vital Signs BP (!) 148/96 (BP Location: Left Arm)   Pulse 76   Temp 98.5 F (36.9 C) (Oral)   Resp 16   SpO2 100%    Physical Exam Vitals and nursing note reviewed.  Constitutional:      General: She is not in acute distress.    Appearance: Normal appearance.  HENT:     Mouth/Throat:     Mouth: Mucous membranes are moist.     Pharynx: Oropharynx is clear. No posterior oropharyngeal erythema.  Eyes:     Conjunctiva/sclera: Conjunctivae normal.     Pupils: Pupils are equal, round, and reactive to light.  Cardiovascular:     Rate and Rhythm: Normal rate and regular rhythm.     Pulses: Normal pulses.     Heart sounds: Normal heart sounds.  Pulmonary:     Effort: Pulmonary effort is normal. No respiratory distress.     Breath sounds: Normal breath sounds. No wheezing.  Abdominal:     Palpations: Abdomen is soft.     Tenderness: There is no abdominal tenderness.  Musculoskeletal:         General: Normal range of motion.     Cervical back: Normal range of motion.  Skin:    General: Skin is warm and dry.     Findings: No rash.     Comments: Hives have resolved but patient reports skin is still itchy all over  Neurological:     Mental Status: She is alert and oriented to person, place, and time.     UC Treatments / Results  Labs (all labs ordered are listed, but only abnormal results are displayed) Labs Reviewed - No data to display  EKG  Radiology No results found.  Procedures Procedures   Medications Ordered in UC Medications - No data to display  Initial Impression / Assessment and Plan / UC Course  I have reviewed the triage vital signs and the nursing notes.  Pertinent labs & imaging results that were available during my care of the patient were reviewed by me and considered in my medical decision making (see chart for details).  Will try antihistamine regimen of daily Zyrtec and nightly Pepcid.  She can keep doing Benadryl at night as needed.  Recommend follow-up with primary care provider, return to urgent care if symptoms persist. Work note provided.  ED precautions for worsening symptoms.  Patient agrees to plan  Final Clinical Impressions(s) / UC Diagnoses   Final diagnoses:  Itching  Hives  Food allergy     Discharge Instructions      I recommend taking the Zyrtec daily and the Pepcid nightly for the next week or so.  You can add Benadryl at nighttime as well.  The combination of these medicines should help with your itching and hives.  Please go to the emergency department if symptoms worsen.    ED Prescriptions     Medication Sig Dispense Auth. Provider   cetirizine (ZYRTEC ALLERGY) 10 MG tablet Take 1 tablet (10 mg total) by mouth daily. 30 tablet Daje Stark, PA-C   famotidine (PEPCID) 20 MG tablet Take 1 tablet (20 mg total) by mouth at bedtime. 30 tablet Arabel Barcenas, Lurena Joiner, PA-C      PDMP not reviewed this encounter.    Maleaha Hughett, Ray Church 07/09/22 [phone]

## 2022-07-09 NOTE — ED Triage Notes (Signed)
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 5 days ago states her coworker was chopping onions near her and she's broken out in hives for the last five days. Has been taking benadryl to try and help without relief.

## 2023-02-17 ENCOUNTER — Encounter (HOSPITAL_COMMUNITY): Payer: Self-pay

## 2023-02-17 ENCOUNTER — Emergency Department (HOSPITAL_COMMUNITY)
Admission: EM | Admit: 2023-02-17 | Discharge: 2023-02-17 | Disposition: A | Discharge status: Home / Self Care | Payer: Commercial Managed Care - HMO | Attending: Emergency Medicine | Admitting: Emergency Medicine

## 2023-02-17 ENCOUNTER — Other Ambulatory Visit: Payer: Self-pay

## 2023-02-17 DIAGNOSIS — H5789 Other specified disorders of eye and adnexa: Secondary | ICD-10-CM | POA: Diagnosis present

## 2023-02-17 DIAGNOSIS — H1089 Other conjunctivitis: Secondary | ICD-10-CM | POA: Diagnosis not present

## 2023-02-17 DIAGNOSIS — H109 Unspecified conjunctivitis: Secondary | ICD-10-CM

## 2023-02-17 DIAGNOSIS — A64 Unspecified sexually transmitted disease: Secondary | ICD-10-CM | POA: Insufficient documentation

## 2023-02-17 LAB — WET PREP, GENITAL
Clue Cells Wet Prep HPF POC: NONE SEEN
Sperm: NONE SEEN
Trich, Wet Prep: NONE SEEN
WBC, Wet Prep HPF POC: <10 (ref <10)
Yeast Wet Prep HPF POC: NONE SEEN

## 2023-02-17 LAB — URINALYSIS, ROUTINE W REFLEX MICROSCOPIC
Bilirubin Urine: NEGATIVE
Glucose, UA: NEGATIVE mg/dL
Hgb urine dipstick: NEGATIVE
Ketones, ur: 5 mg/dL — AB
Leukocytes,Ua: NEGATIVE
Nitrite: NEGATIVE
Protein, ur: NEGATIVE mg/dL
Specific Gravity, Urine: 1.009 (ref 1.005–1.030)
pH: 6 (ref 5.0–8.0)

## 2023-02-17 LAB — HIV ANTIBODY (ROUTINE TESTING W REFLEX): HIV Screen 4th Generation wRfx: NONREACTIVE

## 2023-02-17 LAB — PREGNANCY, URINE: Preg Test, Ur: NEGATIVE

## 2023-02-17 MED ORDER — POLYMYXIN B-TRIMETHOPRIM 10000-0.1 UNIT/ML-% OP SOLN: 2.0000 [drp] | Freq: Four times a day (QID) | OPHTHALMIC | 0 refills | Status: AC

## 2023-02-17 MED ORDER — LIDOCAINE HCL (PF) 1 % IJ SOLN
INTRAMUSCULAR | Status: AC
  Administered 2023-02-17: 2.1 mL
  Filled 2023-02-17: qty 5

## 2023-02-17 MED ORDER — DOXYCYCLINE HYCLATE 100 MG PO CAPS: 100.0000 mg | ORAL_CAPSULE | Freq: Two times a day (BID) | ORAL | 0 refills | Status: AC

## 2023-02-17 MED ORDER — CEFTRIAXONE SODIUM 1 G IJ SOLR
1.0000 g | Freq: Once | INTRAMUSCULAR | Status: AC
  Administered 2023-02-17: 1 g via INTRAMUSCULAR
  Filled 2023-02-17: qty 10

## 2023-02-17 MED ORDER — DOXYCYCLINE HYCLATE 100 MG PO TABS
100.0000 mg | ORAL_TABLET | Freq: Once | ORAL | Status: AC
  Administered 2023-02-17: 100 mg via ORAL
  Filled 2023-02-17: qty 1

## 2023-02-17 NOTE — Discharge Instructions (Signed)
You are seen in the emergency department diagnosed with bacterial conjunctivitis of the left eye as well as exposure to an STI.  He was treated prophylactically for the STI with a dose of Rocephin as well as doxycycline.  A prescription for doxycycline is been sent to your pharmacy which you should take 2 times daily for the next 7 days in its entirety to ensure that symptoms resolved.  I also sent a prescription for Polytrim ophthalmic drops which should apply to the left eye every 6 hours for the next 7 days to ensure that this is healing properly.  I will also provide you with information for an ophthalmologist that you should plan to follow-up with to ensure that you are healing well without any complications in the eyes.  Plan to follow-up with her primary care provider for further evaluation to ensure that everything is healing properly.  If you feel your symptoms are worsening, please return to the emergency department for further evaluation.

## 2023-02-17 NOTE — ED Provider Notes (Signed)
[place] EMERGENCY DEPARTMENT AT [facility] Provider Note   CSN: [id number] Arrival date & time: 02/17/23  1841     History Chief Complaint  Patient presents with   Conjunctivitis    Heather Farrell is a 37 y.o. female.  Patient presents emergency department complaints of conjunctivitis.  Patient is also requesting STI testing. She reports that she has had conjunctivitis for the last 2 days but is unsure of anyone that she has been around that has had similar symptoms. Also concerned about potential STI exposure due to dysuria and increased urinary frequency although she has been with the same sexual partner.   Conjunctivitis       Home Medications Prior to Admission medications   Medication Sig Start Date End Date Taking? Authorizing Provider  doxycycline (VIBRAMYCIN) 100 MG capsule Take 1 capsule (100 mg total) by mouth 2 (two) times daily for 7 days. 02/17/23 02/24/23 Yes Smitty Knudsen, PA-C  trimethoprim-polymyxin b (POLYTRIM) ophthalmic solution Place 2 drops into the left eye every 6 (six) hours for 7 days. 02/17/23 02/24/23 Yes Smitty Knudsen, PA-C  amLODipine (NORVASC) 5 MG tablet Take 1 tablet (5 mg total) by mouth daily. Patient not taking: Reported on 06/09/2021 09/01/19   Mickie Bail, NP  cetirizine (ZYRTEC ALLERGY) 10 MG tablet Take 1 tablet (10 mg total) by mouth daily. 07/09/22   Rising, Lurena Joiner, PA-C  famotidine (PEPCID) 20 MG tablet Take 1 tablet (20 mg total) by mouth at bedtime. 07/09/22   Rising, Lurena Joiner, PA-C  fluconazole (DIFLUCAN) 150 MG tablet Take one tablet by mouth as a single dose. May repeat in 3 days if symptoms persist. 02/28/22   Mardella Layman, MD  metroNIDAZOLE (FLAGYL) 500 MG tablet Take 1 tablet (500 mg total) by mouth 2 (two) times daily. 02/28/22   Mardella Layman, MD  Multiple Vitamins-Minerals (MULTIVITAMIN ADULTS PO) Take 1 tablet by mouth 2 (two) times a week.    [provider]  sertraline (ZOLOFT) 25 MG tablet TAKE 1 TABLET(25  MG) BY MOUTH AT BEDTIME 02/28/22   Mardella Layman, MD      Allergies    Onion    Review of Systems   Review of Systems  Eyes:  Positive for redness.  Genitourinary:  Positive for dysuria.  All other systems reviewed and are negative.   Physical Exam Updated Vital Signs BP (!) 155/101 (BP Location: Right Arm)   Pulse (!) 54   Temp 98.1 F (36.7 C) (Oral)   Resp 18   Ht 5\' 2"  (1.575 m)   Wt 87.1 kg   SpO2 100%   BMI 35.12 kg/m  Physical Exam Vitals and nursing note reviewed.  Constitutional:      General: She is not in acute distress.    Appearance: She is well-developed.  HENT:     Head: Normocephalic and atraumatic.  Eyes:     General:        Left eye: Discharge present.    Extraocular Movements: Extraocular movements intact.     Pupils: Pupils are equal, round, and reactive to light.  Cardiovascular:     Rate and Rhythm: Normal rate and regular rhythm.     Heart sounds: No murmur heard. Pulmonary:     Effort: Pulmonary effort is normal. No respiratory distress.     Breath sounds: Normal breath sounds.  Abdominal:     Palpations: Abdomen is soft.     Tenderness: There is no abdominal tenderness.  Musculoskeletal:  General: No swelling.     Cervical back: Neck supple.  Skin:    General: Skin is warm and dry.     Capillary Refill: Capillary refill takes less than 2 seconds.  Neurological:     Mental Status: She is alert.  Psychiatric:        Mood and Affect: Mood normal.     ED Results / Procedures / Treatments   Labs (all labs ordered are listed, but only abnormal results are displayed) Labs Reviewed  URINALYSIS, ROUTINE W REFLEX MICROSCOPIC - Abnormal; Notable for the following components:      Result Value   Ketones, ur 5 (*)    All other components within normal limits  WET PREP, GENITAL  PREGNANCY, URINE  HIV ANTIBODY (ROUTINE TESTING W REFLEX)  GC/CHLAMYDIA PROBE AMP ([place]) NOT AT [facility]    EKG None  Radiology No results  found.  Procedures Procedures   Medications Ordered in ED Medications  cefTRIAXone (ROCEPHIN) injection 1 g (1 g Intramuscular Given 02/17/23 2225)  doxycycline (VIBRA-TABS) tablet 100 mg (100 mg Oral Given 02/17/23 2225)  lidocaine (PF) (XYLOCAINE) 1 % injection (2.1 mLs  Given 02/17/23 2225)    ED Course/ Medical Decision Making/ A&P                           Medical Decision Making Amount and/or Complexity of Data Reviewed Labs: ordered.  Risk Prescription drug management.   This patient presents to the ED for concern of conjunctivitis, dysuria. Differential diagnosis includes bacterial conjunctivitis, viral conjunctivitis, seasonal allergies, UTI, STI   Lab Tests:  I Ordered, and personally interpreted labs.  The pertinent results include: UA negative, wet prep negative, negative HIV, negative pregnancy, pending GC/chlamydia   Medicines ordered and prescription drug management:  I ordered medication including Rocephin, doxycycline  for STI  Reevaluation of the patient after these medicines showed that the patient unchanged I have reviewed the patients home medicines and have made adjustments as needed   Problem List / ED Course:  Patient presented to the ED with concerns of conjunctivitis and requesting STI testing. Has had left eye redness and irritation for two days with some mucopurulent drainage. On exam, it does appear that there is conjunctivitis present in the left eye with right eye being normal. Will treat with Polytrim drops and outpatient ophthalmology follow up. GC/chlamydia also ordered per patient request with results pending. Will treat given concern for STI exposure. Advised patient that labs will be available via MyChart. Patient agreeable with treatment plan and verbalized understanding all return precautions. All questions answered prior to patient discharge.  Final Clinical Impression(s) / ED Diagnoses Final diagnoses:  Bacterial conjunctivitis of left  eye  STI (sexually transmitted infection)    Rx / DC Orders ED Discharge Orders          Ordered    doxycycline (VIBRAMYCIN) 100 MG capsule  2 times daily        02/17/23 2201    trimethoprim-polymyxin b (POLYTRIM) ophthalmic solution  Every 6 hours        02/17/23 2201              Smitty Knudsen, PA-C 02/17/23 2351    Linwood Dibbles, MD 02/18/23 1006

## 2023-02-17 NOTE — ED Triage Notes (Signed)
Pt arrived via POV with c/o of pink eye and a hand rash everytime she uses a certain hand soap.(L) eye is red and leaking.She is also c/o of eating certain foods and the smell comes out in her pores.This has been happening for over a yr.She has been put on a bacterial infection medication before and it helps but once off of it, problem reoccurs.PT would also like STD testing.

## 2023-07-03 ENCOUNTER — Other Ambulatory Visit (HOSPITAL_COMMUNITY)
Admission: RE | Admit: 2023-07-03 | Discharge: 2023-07-03 | Disposition: A | Discharge status: Home / Self Care | Payer: 59 | Source: Ambulatory Visit | Attending: Family | Admitting: Family

## 2023-07-03 ENCOUNTER — Ambulatory Visit (INDEPENDENT_AMBULATORY_CARE_PROVIDER_SITE_OTHER): Payer: 59 | Admitting: Family

## 2023-07-03 ENCOUNTER — Encounter: Payer: Self-pay | Admitting: Family

## 2023-07-03 VITALS — BP 137/93 | HR 61 | Temp 98.1°F | Ht 63.75 in | Wt 193.4 lb

## 2023-07-03 DIAGNOSIS — N390 Urinary tract infection, site not specified: Secondary | ICD-10-CM | POA: Diagnosis not present

## 2023-07-03 DIAGNOSIS — Z13228 Encounter for screening for other metabolic disorders: Secondary | ICD-10-CM

## 2023-07-03 DIAGNOSIS — Z0000 Encounter for general adult medical examination without abnormal findings: Secondary | ICD-10-CM | POA: Diagnosis not present

## 2023-07-03 DIAGNOSIS — Z130 Encounter for screening for diseases of the blood and blood-forming organs and certain disorders involving the immune mechanism: Secondary | ICD-10-CM

## 2023-07-03 DIAGNOSIS — R030 Elevated blood-pressure reading, without diagnosis of hypertension: Secondary | ICD-10-CM

## 2023-07-03 DIAGNOSIS — Z114 Encounter for screening for human immunodeficiency virus [HIV]: Secondary | ICD-10-CM | POA: Diagnosis not present

## 2023-07-03 DIAGNOSIS — F32A Depression, unspecified: Secondary | ICD-10-CM

## 2023-07-03 DIAGNOSIS — Z1329 Encounter for screening for other suspected endocrine disorder: Secondary | ICD-10-CM

## 2023-07-03 DIAGNOSIS — K5900 Constipation, unspecified: Secondary | ICD-10-CM | POA: Diagnosis not present

## 2023-07-03 DIAGNOSIS — Z13220 Encounter for screening for lipoid disorders: Secondary | ICD-10-CM

## 2023-07-03 DIAGNOSIS — F17210 Nicotine dependence, cigarettes, uncomplicated: Secondary | ICD-10-CM | POA: Diagnosis not present

## 2023-07-03 DIAGNOSIS — Z131 Encounter for screening for diabetes mellitus: Secondary | ICD-10-CM | POA: Diagnosis not present

## 2023-07-03 DIAGNOSIS — Z113 Encounter for screening for infections with a predominantly sexual mode of transmission: Secondary | ICD-10-CM

## 2023-07-03 DIAGNOSIS — F419 Anxiety disorder, unspecified: Secondary | ICD-10-CM | POA: Diagnosis not present

## 2023-07-03 LAB — POCT URINALYSIS DIP (CLINITEK)
Bilirubin, UA: NEGATIVE
Blood, UA: NEGATIVE
Glucose, UA: NEGATIVE mg/dL
Ketones, POC UA: NEGATIVE mg/dL
Leukocytes, UA: NEGATIVE
Nitrite, UA: NEGATIVE
POC PROTEIN,UA: NEGATIVE
Spec Grav, UA: >=1.03 — AB (ref 1.010–1.025)
Urobilinogen, UA: 0.2 E.U./dL
pH, UA: 6 (ref 5.0–8.0)

## 2023-07-03 MED ORDER — POLYETHYLENE GLYCOL 3350 17 G PO PACK: 17.0000 g | PACK | Freq: Every day | ORAL | 0 refills | Status: AC | PRN

## 2023-07-03 MED ORDER — SERTRALINE HCL 25 MG PO TABS: 25.0000 mg | ORAL_TABLET | Freq: Every day | ORAL | 1 refills | Status: DC

## 2023-07-03 NOTE — Progress Notes (Signed)
Pt states problems with stomach, states she can't defecate often.   Pt states her boyfriend keeps complaining about odors, states whatever she eats comes out of her pores.   Pt wants to get tested for all stds.  Pt wants to get on her depression meds.   Pt states having insomnia.   Pt states she is just getting over a cold and is always tired, has no energy.   Pt states pain in ears on and off.

## 2023-07-03 NOTE — Progress Notes (Signed)
Patient ID: Heather Farrell, female    DOB: 06/30/1986  MRN: [id number]  CC: Annual Physical Exam  Subjective: Heather Farrell is a 37 y.o. female who presents for annual physical exam.   Her concerns today include:  - Constipation. She denies associated red flag symptoms.  - States her boyfriend told her her "pores have an odor". She would like STD testing.  - Anxiety depression related to work-life balance. Would like to begin Zoloft again. She denies thoughts of self-harm, suicidal ideations, homicidal ideations. She declines referral to therapist.  - Reports has been several years since she took Amlodipine for high blood pressure. She does not complain of red flag symptoms such as but not limited to chest pain, shortness of breath, worst headache of life, nausea/vomiting.  - No further issues/concerns for discussion today.   Patient Active Problem List   Diagnosis Date Noted   Bacterial vaginitis 05/12/2021   Depression 05/11/2021   GAD (generalized anxiety disorder) 05/11/2021     Current Outpatient Medications on File Prior to Visit  Medication Sig Dispense Refill   amLODipine (NORVASC) 5 MG tablet Take 1 tablet (5 mg total) by mouth daily. (Patient not taking: Reported on 06/09/2021) 30 tablet 0   cetirizine (ZYRTEC ALLERGY) 10 MG tablet Take 1 tablet (10 mg total) by mouth daily. (Patient not taking: Reported on 07/03/2023) 30 tablet 2   famotidine (PEPCID) 20 MG tablet Take 1 tablet (20 mg total) by mouth at bedtime. (Patient not taking: Reported on 07/03/2023) 30 tablet 2   fluconazole (DIFLUCAN) 150 MG tablet Take one tablet by mouth as a single dose. May repeat in 3 days if symptoms persist. (Patient not taking: Reported on 07/03/2023) 2 tablet 0   metroNIDAZOLE (FLAGYL) 500 MG tablet Take 1 tablet (500 mg total) by mouth 2 (two) times daily. (Patient not taking: Reported on 07/03/2023) 14 tablet 0   Multiple Vitamins-Minerals (MULTIVITAMIN ADULTS PO) Take 1 tablet by mouth 2 (two)  times a week. (Patient not taking: Reported on 07/03/2023)     No current facility-administered medications on file prior to visit.    Allergies  Allergen Reactions   Onion Anaphylaxis    Social History   Socioeconomic History   Marital status: Single    Spouse name: Not on file   Number of children: Not on file   Years of education: Not on file   Highest education level: Not on file  Occupational History   Not on file  Tobacco Use   Smoking status: Every Day    Current packs/day: 1.50    Types: Cigarettes   Smokeless tobacco: Never  Vaping Use   Vaping status: Never Used  Substance and Sexual Activity   Alcohol use: Yes    Comment: occassionally    Drug use: Yes    Types: Marijuana   Sexual activity: Yes    Birth control/protection: Surgical  Other Topics Concern   Not on file  Social History Narrative   Not on file   Social Determinants of Health   Financial Resource Strain: Not on File (03/08/2022)   Received from [employer], [employer]    Financial Resource Strain: 0  Food Insecurity: Not on File (03/08/2022)   Received from [place], [place]   Food Insecurity    Food: 0  Transportation Needs: Not on File (03/08/2022)   Received from [employer], [employer] Needs    Transportation: 0  Physical Activity: Not on File (03/08/2022)  Received from [place], [place]   Physical Activity    Physical Activity: 0  Stress: Not on File (03/08/2022)   Received from [facility], [place]   Stress    Stress: 0  Social Connections: Not on File (03/08/2022)   Received from [place], [place]   Social Connections    Social Connections and Isolation: 0  Intimate Partner Violence: Not on file    Family History  Problem Relation Age of Onset   Healthy Mother    Cancer Other    Diabetes Other    Hypertension Other     Past Surgical History:  Procedure Laterality Date   TUBAL LIGATION      ROS: Review of Systems Negative except as stated above  PHYSICAL  EXAM: BP (!) 137/93   Pulse 61   Temp 98.1 F (36.7 C) (Oral)   Ht 5' 3.75" (1.619 m)   Wt 193 lb 6.4 oz (87.7 kg)   LMP 06/12/2023 (Approximate)   SpO2 98%   BMI 33.46 kg/m   Physical Exam HENT:     Head: Normocephalic and atraumatic.     Right Ear: Tympanic membrane, ear canal and external ear normal.     Left Ear: Tympanic membrane, ear canal and external ear normal.     Nose: Nose normal.     Mouth/Throat:     Mouth: Mucous membranes are moist.     Pharynx: Oropharynx is clear.  Eyes:     Extraocular Movements: Extraocular movements intact.     Conjunctiva/sclera: Conjunctivae normal.     Pupils: Pupils are equal, round, and reactive to light.  Cardiovascular:     Rate and Rhythm: Normal rate and regular rhythm.     Pulses: Normal pulses.     Heart sounds: Normal heart sounds.  Pulmonary:     Effort: Pulmonary effort is normal.     Breath sounds: Normal breath sounds.  Chest:     Comments: Patient declined.  Abdominal:     General: Bowel sounds are normal.  Genitourinary:    Comments: Patient declined.  Musculoskeletal:        General: Normal range of motion.     Right shoulder: Normal.     Left shoulder: Normal.     Right upper arm: Normal.     Left upper arm: Normal.     Right elbow: Normal.     Left elbow: Normal.     Right forearm: Normal.     Left forearm: Normal.     Right wrist: Normal.     Left wrist: Normal.     Right hand: Normal.     Left hand: Normal.     Cervical back: Normal, normal range of motion and neck supple.     Thoracic back: Normal.     Lumbar back: Normal.     Right hip: Normal.     Left hip: Normal.     Right upper leg: Normal.     Left upper leg: Normal.     Right knee: Normal.     Left knee: Normal.     Right lower leg: Normal.     Left lower leg: Normal.     Right ankle: Normal.     Left ankle: Normal.     Right foot: Normal.     Left foot: Normal.  Skin:    General: Skin is warm and dry.     Capillary Refill:  Capillary refill takes less than 2 seconds.  Neurological:  General: No focal deficit present.     Mental Status: She is alert and oriented to person, place, and time.  Psychiatric:        Mood and Affect: Mood normal.        Behavior: Behavior normal.     ASSESSMENT AND PLAN: 1. Annual physical exam - Counseled on 150 minutes of exercise per week as tolerated, healthy eating (including decreased daily intake of saturated fats, cholesterol, added sugars, sodium), STI prevention, and routine healthcare maintenance.  2. Screening for metabolic disorder - Routine screening.  - CMP14+EGFR  3. Screening for deficiency anemia - Routine screening.  - CBC  4. Diabetes mellitus screening - Routine screening.  - Hemoglobin A1c  5. Screening cholesterol level - Routine screening.  - Lipid panel  6. Thyroid disorder screen - Routine screening.  - TSH  7. Anxiety and depression - Patient denies thoughts of self-harm, suicidal ideations, homicidal ideations. - Sertraline as prescribed. Counseled on medication adherence/adverse effects. - Patient declined referral to Psychiatry.  - Follow-up with primary provider in 4 weeks or sooner if needed.  - sertraline (ZOLOFT) 25 MG tablet; Take 1 tablet (25 mg total) by mouth daily.  Dispense: 30 tablet; Refill: 1  8. Routine screening for STI (sexually transmitted infection) - Routine screening.  - Cervicovaginal ancillary only  9. Encounter for screening for HIV - Routine screening.  - HIV antibody (with reflex)  10. Constipation, unspecified constipation type - Miralax as prescribed. Counseled on medication adherence.  - polyethylene glycol (MIRALAX) 17 g packet; Take 17 g by mouth daily as needed.  Dispense: 14 each; Refill: 0  11. Elevated blood pressure reading - Blood pressure not at goal during today's visit. Patient asymptomatic without chest pressure, chest pain, palpitations, shortness of breath, worst headache of life,  and any additional red flag symptoms. - Follow-up with primary provider in 2 to 4 weeks or sooner if needed for blood pressure check.     Patient was given the opportunity to ask questions.  Patient verbalized understanding of the plan and was able to repeat key elements of the plan. Patient was given clear instructions to go to Emergency Department or return to medical center if symptoms don't improve, worsen, or new problems develop.The patient verbalized understanding.   Orders Placed This Encounter  Procedures   CBC   Lipid panel   TSH   CMP14+EGFR   Hemoglobin A1c   HIV antibody (with reflex)     Requested Prescriptions   Signed Prescriptions Disp Refills   polyethylene glycol (MIRALAX) 17 g packet 14 each 0    Sig: Take 17 g by mouth daily as needed.   sertraline (ZOLOFT) 25 MG tablet 30 tablet 1    Sig: Take 1 tablet (25 mg total) by mouth daily.    Return in about 1 year (around 07/02/2024) for Physical per patient preference, Follow-Up or next available 4 weeks .  [name], NP

## 2023-07-03 NOTE — Patient Instructions (Signed)

## 2023-07-03 NOTE — Addendum Note (Signed)
Addended by: Claudie Leach on: 07/03/2023 03:10 PM   Modules accepted: Orders

## 2023-07-04 ENCOUNTER — Encounter: Payer: Self-pay | Admitting: Family

## 2023-07-04 ENCOUNTER — Other Ambulatory Visit: Payer: Self-pay | Admitting: Family

## 2023-07-04 ENCOUNTER — Ambulatory Visit: Payer: Self-pay

## 2023-07-04 DIAGNOSIS — R7303 Prediabetes: Secondary | ICD-10-CM | POA: Insufficient documentation

## 2023-07-04 DIAGNOSIS — B9689 Other specified bacterial agents as the cause of diseases classified elsewhere: Secondary | ICD-10-CM

## 2023-07-04 LAB — CMP14+EGFR
ALT: 13 IU/L (ref 0–32)
AST: 16 IU/L (ref 0–40)
Albumin: 3.9 g/dL (ref 3.9–4.9)
Alkaline Phosphatase: 30 IU/L — ABNORMAL LOW (ref 44–121)
BUN/Creatinine Ratio: 9 (ref 9–23)
BUN: 8 mg/dL (ref 6–20)
Bilirubin Total: <0.2 mg/dL (ref 0.0–1.2)
CO2: 24 mmol/L (ref 20–29)
Calcium: 9 mg/dL (ref 8.7–10.2)
Chloride: 105 mmol/L (ref 96–106)
Creatinine, Ser: 0.91 mg/dL (ref 0.57–1.00)
Globulin, Total: 2.2 g/dL (ref 1.5–4.5)
Glucose: 86 mg/dL (ref 70–99)
Potassium: 4.7 mmol/L (ref 3.5–5.2)
Sodium: 141 mmol/L (ref 134–144)
Total Protein: 6.1 g/dL (ref 6.0–8.5)
eGFR: 82 mL/min/{1.73_m2} (ref >59)

## 2023-07-04 LAB — CERVICOVAGINAL ANCILLARY ONLY
Bacterial Vaginitis (gardnerella): POSITIVE — AB
Candida Glabrata: NEGATIVE
Candida Vaginitis: NEGATIVE
Chlamydia: NEGATIVE
Comment: NEGATIVE
Comment: NEGATIVE
Comment: NEGATIVE
Comment: NEGATIVE
Comment: NEGATIVE
Comment: NORMAL
Neisseria Gonorrhea: NEGATIVE
Trichomonas: NEGATIVE

## 2023-07-04 LAB — HIV ANTIBODY (ROUTINE TESTING W REFLEX): HIV Screen 4th Generation wRfx: NONREACTIVE

## 2023-07-04 LAB — CBC
Hematocrit: 42.7 % (ref 34.0–46.6)
Hemoglobin: 14.1 g/dL (ref 11.1–15.9)
MCH: 28.7 pg (ref 26.6–33.0)
MCHC: 33 g/dL (ref 31.5–35.7)
MCV: 87 fL (ref 79–97)
Platelets: 277 10*3/uL (ref 150–450)
RBC: 4.91 x10E6/uL (ref 3.77–5.28)
RDW: 13.2 % (ref 11.7–15.4)
WBC: 5.6 10*3/uL (ref 3.4–10.8)

## 2023-07-04 LAB — HEMOGLOBIN A1C
Est. average glucose Bld gHb Est-mCnc: 117 mg/dL
Hgb A1c MFr Bld: 5.7 % — ABNORMAL HIGH (ref 4.8–5.6)

## 2023-07-04 LAB — TSH: TSH: 1.26 u[IU]/mL (ref 0.450–4.500)

## 2023-07-04 LAB — LIPID PANEL
Chol/HDL Ratio: 4.3 ratio (ref 0.0–4.4)
Cholesterol, Total: 158 mg/dL (ref 100–199)
HDL: 37 mg/dL — ABNORMAL LOW (ref >39)
LDL Chol Calc (NIH): 101 mg/dL — ABNORMAL HIGH (ref 0–99)
Triglycerides: 111 mg/dL (ref 0–149)
VLDL Cholesterol Cal: 20 mg/dL (ref 5–40)

## 2023-07-04 MED ORDER — METRONIDAZOLE 500 MG PO TABS
500.0000 mg | ORAL_TABLET | Freq: Two times a day (BID) | ORAL | 0 refills | Status: AC
Start: 2023-07-04 — End: 2023-07-11

## 2023-07-04 NOTE — Telephone Encounter (Signed)
Message from Heather Farrell sent at 07/04/2023 10:48 AM EDT  Summary: results/questions   Patient has called in regards to Lab results she recieved over MyChart from tests that she had done yesterday, 07/03/2023, and she was questioning about if they tested her for BV? Per patient states she always has this test done and she does not see results for this. Please advise. Patients callback # [phone]        Called pt and LM on VM to call back.

## 2023-07-04 NOTE — Telephone Encounter (Signed)
  Chief Complaint: requesting results regarding BV.  Symptoms: na Frequency: na Pertinent Negatives: Patient denies na Disposition: [] ED /[] Urgent Care (no appt availability in office) / [] Appointment(In office/virtual)/ []  [facility]/ [] Home Care/ [] Refused Recommended Disposition /[] [facility]/ [x]  Follow-up with PCP Additional Notes:   Reviewed results from labs yesterday and cervicoancillary results are still in process and reviewed with patient PCP will review results when they come back and office will call her with PCP response.   Pt given lab results per notes of A. Zonia Kief, NP from 07/04/23 on 07/04/23. Pt verbalized understanding. Recommended patient to call back to schedule appt to recheck prediabetes in 6 months and fasting cholesterol in 3 months. Patient only concerned regarding BV results at this time.       Reason for Disposition  [1] Follow-up call to recent contact AND [2] information only call, no triage required  Answer Assessment - Initial Assessment Questions 1. REASON FOR CALL or QUESTION: "What is your reason for calling today?" or "How can I best help you?" or "What question do you have that I can help answer?"     Requesting results for cervicoancillary results and if she was checked for BV.  Protocols used: Information Only Call - No Triage-A-AH

## 2023-07-06 ENCOUNTER — Ambulatory Visit: Payer: Commercial Managed Care - HMO | Admitting: Family

## 2023-08-07 ENCOUNTER — Encounter: Payer: 59 | Admitting: Family

## 2023-08-07 NOTE — Progress Notes (Signed)
Erroneous encounter-disregard

## 2023-12-18 ENCOUNTER — Ambulatory Visit (HOSPITAL_COMMUNITY)
Admission: EM | Admit: 2023-12-18 | Discharge: 2023-12-18 | Disposition: A | Discharge status: Home / Self Care | Payer: 59 | Attending: Nurse Practitioner | Admitting: Nurse Practitioner

## 2023-12-18 ENCOUNTER — Encounter (HOSPITAL_COMMUNITY): Payer: Self-pay

## 2023-12-18 DIAGNOSIS — Z113 Encounter for screening for infections with a predominantly sexual mode of transmission: Secondary | ICD-10-CM | POA: Insufficient documentation

## 2023-12-18 DIAGNOSIS — L750 Bromhidrosis: Secondary | ICD-10-CM | POA: Insufficient documentation

## 2023-12-18 NOTE — ED Triage Notes (Addendum)
 Patient here today with c/o body odor and lower abd discomfort for 3 years now. Patient states that she has been seen in the past for BV but the treatment that she is put on does not help. Patient states that she notices it more after having intercourse and even when she is just sweating. Patient states that it is affecting her relationship with her boyfriend.

## 2023-12-18 NOTE — ED Provider Notes (Signed)
 [facility]    CSN: [id number] Arrival date & time: 12/18/23  1736      History   Chief Complaint Chief Complaint  Patient presents with   Body Odor    HPI Heather Farrell is a 38 y.o. female.   Patient presents today for 3-year history of abnormal body odor.  She reports whenever she eats leaks out through my pores.  She reports a bad body odor no matter what she cleans her skin with.  She is concerned because [place] and wonders if this may be the cause.  She also reports when she does not have regular bowel movements, she thinks this attributes to the odor.  She also wonders if her hormones may be out of balance.  She also reports history of recurrent BV, but does not think she has an issue with that right now.  Reports she has talked to her primary care provider about this and they have never found the cause.  She does not have a OB/GYN but is interested in establishing care with one.  She is sexually active and requesting STI testing today.  She denies any symptoms today including vaginal discharge, rashes, sores, or bumps on her genitalia.  No abdominal pain, nausea/vomiting, or fevers.    Past Medical History:  Diagnosis Date   Hypertension    Seasonal allergies     Patient Active Problem List   Diagnosis Date Noted   Prediabetes 07/04/2023   Bacterial vaginitis 05/12/2021   Depression 05/11/2021   GAD (generalized anxiety disorder) 05/11/2021    Past Surgical History:  Procedure Laterality Date   TUBAL LIGATION      OB History   No obstetric history on file.      Home Medications    Prior to Admission medications   Medication Sig Start Date End Date Taking? Authorizing Provider  amLODipine  (NORVASC ) 5 MG tablet Take 1 tablet (5 mg total) by mouth daily. Patient not taking: Reported on 06/09/2021 09/01/19   Corlis Burnard DEL, NP  cetirizine  (ZYRTEC  ALLERGY) 10 MG tablet Take 1 tablet (10 mg total) by mouth daily. Patient not taking:  Reported on 07/03/2023 07/09/22   Rising, Asberry, PA-C  famotidine  (PEPCID ) 20 MG tablet Take 1 tablet (20 mg total) by mouth at bedtime. Patient not taking: Reported on 07/03/2023 07/09/22   Rising, Asberry, PA-C  Multiple Vitamins-Minerals (MULTIVITAMIN ADULTS PO) Take 1 tablet by mouth 2 (two) times a week. Patient not taking: Reported on 07/03/2023    [provider]  polyethylene glycol (MIRALAX ) 17 g packet Take 17 g by mouth daily as needed. 07/03/23   Lorren Greig PARAS, NP  sertraline  (ZOLOFT ) 25 MG tablet Take 1 tablet (25 mg total) by mouth daily. 07/03/23   Lorren Greig PARAS, NP    Family History Family History  Problem Relation Age of Onset   Healthy Mother    Cancer Other    Diabetes Other    Hypertension Other     Social History Social History   Tobacco Use   Smoking status: Every Day    Current packs/day: 1.50    Types: Cigarettes   Smokeless tobacco: Never  Vaping Use   Vaping status: Never Used  Substance Use Topics   Alcohol use: Yes    Comment: occassionally    Drug use: Yes    Types: Marijuana     Allergies   Onion   Review of Systems Review of Systems Per HPI  Physical Exam Triage Vital Signs ED Triage Vitals  Encounter Vitals Group     BP 12/18/23 1855 (!) 163/81     Systolic BP Percentile --      Diastolic BP Percentile --      Pulse Rate 12/18/23 1855 75     Resp 12/18/23 1855 16     Temp 12/18/23 1855 98.6 F (37 C)     Temp Source 12/18/23 1855 Oral     SpO2 12/18/23 1855 99 %     Weight 12/18/23 1856 208 lb (94.3 kg)     Height 12/18/23 1856 5' 2 (1.575 m)     Head Circumference --      Peak Flow --      Pain Score 12/18/23 1857 7     Pain Loc --      Pain Education --      Exclude from Growth Chart --    No data found.  Updated Vital Signs BP (!) 163/81 (BP Location: Left Arm)   Pulse 75   Temp 98.6 F (37 C) (Oral)   Resp 16   Ht 5' 2 (1.575 m)   Wt 208 lb (94.3 kg)   LMP 10/31/2023 (Approximate)   SpO2 99%    BMI 38.04 kg/m   Visual Acuity Right Eye Distance:   Left Eye Distance:   Bilateral Distance:    Right Eye Near:   Left Eye Near:    Bilateral Near:     Physical Exam Vitals and nursing note reviewed.  Constitutional:      General: She is not in acute distress.    Appearance: Normal appearance. She is not toxic-appearing.  HENT:     Mouth/Throat:     Mouth: Mucous membranes are moist.     Pharynx: Oropharynx is clear.  Pulmonary:     Effort: Pulmonary effort is normal. No respiratory distress.  Genitourinary:    Comments: Deferred-self swab performed by patient Skin:    General: Skin is warm and dry.     Coloration: Skin is not jaundiced or pale.     Findings: No erythema.  Neurological:     Mental Status: She is alert and oriented to person, place, and time.     Motor: No weakness.     Gait: Gait normal.  Psychiatric:        Mood and Affect: Mood normal.        Behavior: Behavior is cooperative.      UC Treatments / Results  Labs (all labs ordered are listed, but only abnormal results are displayed) Labs Reviewed  CERVICOVAGINAL ANCILLARY ONLY    EKG   Radiology No results found.  Procedures Procedures (including critical care time)  Medications Ordered in UC Medications - No data to display  Initial Impression / Assessment and Plan / UC Course  I have reviewed the triage vital signs and the nursing notes.  Pertinent labs & imaging results that were available during my care of the patient were reviewed by me and considered in my medical decision making (see chart for details).   Patient is mildly hypertensive in urgent care, otherwise vital signs are stable.  1. Body odor Unclear cause I do not detect an odor today Recommended increasing water intake, decreasing energy drinks if she thinks those exacerbate the symptoms Also recommended increasing fiber in diet to help regulate stools, start daily probiotic Recommended follow-up with PCP,  establish care with OB/GYN if she desires hormones to be checked and contact  information provided  2. Screening examination for STD (sexually transmitted disease) Self swab cytology performed by patient Treat as indicated  The patient was given the opportunity to ask questions.  All questions answered to their satisfaction.  The patient is in agreement to this plan.    Final Clinical Impressions(s) / UC Diagnoses   Final diagnoses:  Body odor  Screening examination for STD (sexually transmitted disease)     Discharge Instructions      Recommend decreasing energy drinks, increasing water intake.  Also recommend high-fiber diet to help regulate bowel movements.  You can take a daily probiotic or try Activia yogurt to see if this helps with your concern for the body odor.  Recommend follow-up with PCP and/or OB/GYN regarding long-term concerns.    ED Prescriptions   None    PDMP not reviewed this encounter.   Chandra Harlene LABOR, NP 12/18/23 2020

## 2023-12-18 NOTE — Discharge Instructions (Signed)
 Recommend decreasing energy drinks, increasing water intake.  Also recommend high-fiber diet to help regulate bowel movements.  You can take a daily probiotic or try Activia yogurt to see if this helps with your concern for the body odor.  Recommend follow-up with PCP and/or OB/GYN regarding long-term concerns.

## 2023-12-19 ENCOUNTER — Telehealth (HOSPITAL_COMMUNITY): Payer: Self-pay

## 2023-12-19 LAB — CERVICOVAGINAL ANCILLARY ONLY
Bacterial Vaginitis (gardnerella): POSITIVE — AB
Candida Glabrata: NEGATIVE
Candida Vaginitis: NEGATIVE
Chlamydia: NEGATIVE
Comment: NEGATIVE
Comment: NEGATIVE
Comment: NEGATIVE
Comment: NEGATIVE
Comment: NEGATIVE
Comment: NORMAL
Neisseria Gonorrhea: NEGATIVE
Trichomonas: NEGATIVE

## 2023-12-19 MED ORDER — METRONIDAZOLE 500 MG PO TABS: 500.0000 mg | ORAL_TABLET | Freq: Two times a day (BID) | ORAL | 0 refills | Status: AC

## 2023-12-19 MED ORDER — FLUCONAZOLE 150 MG PO TABS
150.0000 mg | ORAL_TABLET | Freq: Once | ORAL | 0 refills | Status: AC
Start: 2023-12-19 — End: 2023-12-19

## 2023-12-19 NOTE — Telephone Encounter (Signed)
 Pt called stating she reported vaginal symptoms and would like treatment for BV. Pt also requests Diflucan  for abx-associated yeast infection.  Reviewed with patient, verified pharmacy, prescription sent

## 2024-07-13 ENCOUNTER — Ambulatory Visit (HOSPITAL_COMMUNITY)
Admission: EM | Admit: 2024-07-13 | Discharge: 2024-07-13 | Disposition: A | Discharge status: Home / Self Care | Attending: Internal Medicine | Admitting: Internal Medicine

## 2024-07-13 ENCOUNTER — Encounter (HOSPITAL_COMMUNITY): Payer: Self-pay | Admitting: Emergency Medicine

## 2024-07-13 ENCOUNTER — Other Ambulatory Visit: Payer: Self-pay

## 2024-07-13 DIAGNOSIS — Z1152 Encounter for screening for COVID-19: Secondary | ICD-10-CM

## 2024-07-13 DIAGNOSIS — Z760 Encounter for issue of repeat prescription: Secondary | ICD-10-CM | POA: Diagnosis not present

## 2024-07-13 DIAGNOSIS — F32A Depression, unspecified: Secondary | ICD-10-CM

## 2024-07-13 LAB — POC SARS CORONAVIRUS 2 AG -  ED: SARS Coronavirus 2 Ag: NEGATIVE

## 2024-07-13 MED ORDER — SERTRALINE HCL 25 MG PO TABS: 25.0000 mg | ORAL_TABLET | Freq: Every day | ORAL | 1 refills | Status: AC

## 2024-07-13 NOTE — ED Provider Notes (Signed)
 [facility]    CSN: [id number] Arrival date & time: 07/13/24  1202      History   Chief Complaint Chief Complaint  Patient presents with   requesting test for covid    HPI Heather Farrell is a 38 y.o. female.   38 year old female presents urgent care requesting COVID testing.  Her daughter was diagnosed with COVID recently.  The patient was told she needed to be tested for her job.  She denies any symptoms.  She is also requesting refill of her Zoloft .  She is scheduling an appointment with her primary care doctor.  She denies any SI or HI.     Past Medical History:  Diagnosis Date   Hypertension    Seasonal allergies     Patient Active Problem List   Diagnosis Date Noted   Prediabetes 07/04/2023   Bacterial vaginitis 05/12/2021   Depression 05/11/2021   GAD (generalized anxiety disorder) 05/11/2021    Past Surgical History:  Procedure Laterality Date   TUBAL LIGATION      OB History   No obstetric history on file.      Home Medications    Prior to Admission medications   Medication Sig Start Date End Date Taking? Authorizing Provider  amLODipine  (NORVASC ) 5 MG tablet Take 1 tablet (5 mg total) by mouth daily. Patient not taking: Reported on 06/09/2021 09/01/19   Corlis Burnard DEL, NP  cetirizine  (ZYRTEC  ALLERGY) 10 MG tablet Take 1 tablet (10 mg total) by mouth daily. Patient not taking: Reported on 07/03/2023 07/09/22   Rising, Asberry, PA-C  famotidine  (PEPCID ) 20 MG tablet Take 1 tablet (20 mg total) by mouth at bedtime. Patient not taking: Reported on 07/03/2023 07/09/22   Rising, Asberry, PA-C  Multiple Vitamins-Minerals (MULTIVITAMIN ADULTS PO) Take 1 tablet by mouth 2 (two) times a week. Patient not taking: Reported on 07/03/2023    [provider]  polyethylene glycol (MIRALAX ) 17 g packet Take 17 g by mouth daily as needed. 07/03/23   Lorren Greig PARAS, NP  sertraline  (ZOLOFT ) 25 MG tablet Take 1 tablet (25 mg total) by mouth daily. 07/13/24    Teresa Almarie LABOR, PA-C    Family History Family History  Problem Relation Age of Onset   Healthy Mother    Cancer Other    Diabetes Other    Hypertension Other     Social History Social History   Tobacco Use   Smoking status: Every Day    Current packs/day: 1.50    Types: Cigarettes   Smokeless tobacco: Never  Vaping Use   Vaping status: Never Used  Substance Use Topics   Alcohol use: Yes    Comment: occassionally    Drug use: Yes    Types: Marijuana     Allergies   Onion   Review of Systems Review of Systems  Constitutional:  Negative for chills and fever.  HENT:  Negative for ear pain and sore throat.   Eyes:  Negative for pain and visual disturbance.  Respiratory:  Negative for cough and shortness of breath.   Cardiovascular:  Negative for chest pain and palpitations.  Gastrointestinal:  Negative for abdominal pain and vomiting.  Genitourinary:  Negative for dysuria and hematuria.  Musculoskeletal:  Negative for arthralgias and back pain.  Skin:  Negative for color change and rash.  Neurological:  Negative for seizures and syncope.  All other systems reviewed and are negative.    Physical Exam Triage Vital Signs ED Triage Vitals  Encounter Vitals Group     BP 07/13/24 1315 (!) 146/87     Girls Systolic BP Percentile --      Girls Diastolic BP Percentile --      Boys Systolic BP Percentile --      Boys Diastolic BP Percentile --      Pulse Rate 07/13/24 1314 65     Resp 07/13/24 1314 18     Temp 07/13/24 1314 98.2 F (36.8 C)     Temp Source 07/13/24 1314 Oral     SpO2 07/13/24 1314 98 %     Weight --      Height --      Head Circumference --      Peak Flow --      Pain Score 07/13/24 1315 0     Pain Loc --      Pain Education --      Exclude from Growth Chart --    No data found.  Updated Vital Signs BP (!) 146/87 (BP Location: Right Arm)   Pulse 65   Temp 98.2 F (36.8 C) (Oral)   Resp 18   LMP 07/08/2024 (Exact Date)   SpO2  98%   Visual Acuity Right Eye Distance:   Left Eye Distance:   Bilateral Distance:    Right Eye Near:   Left Eye Near:    Bilateral Near:     Physical Exam Vitals and nursing note reviewed.  Constitutional:      General: She is not in acute distress.    Appearance: She is well-developed.  HENT:     Head: Normocephalic and atraumatic.  Eyes:     Conjunctiva/sclera: Conjunctivae normal.  Cardiovascular:     Rate and Rhythm: Normal rate and regular rhythm.     Heart sounds: No murmur heard. Pulmonary:     Effort: Pulmonary effort is normal. No respiratory distress.     Breath sounds: Normal breath sounds.  Abdominal:     Palpations: Abdomen is soft.     Tenderness: There is no abdominal tenderness.  Musculoskeletal:        General: No swelling.     Cervical back: Neck supple.  Skin:    General: Skin is warm and dry.     Capillary Refill: Capillary refill takes less than 2 seconds.  Neurological:     Mental Status: She is alert.  Psychiatric:        Mood and Affect: Mood normal.      UC Treatments / Results  Labs (all labs ordered are listed, but only abnormal results are displayed) Labs Reviewed  POC SARS CORONAVIRUS 2 AG -  ED    EKG   Radiology No results found.  Procedures Procedures (including critical care time)  Medications Ordered in UC Medications - No data to display  Initial Impression / Assessment and Plan / UC Course  I have reviewed the triage vital signs and the nursing notes.  Pertinent labs & imaging results that were available during my care of the patient were reviewed by me and considered in my medical decision making (see chart for details).     Encounter for screening for COVID-19 - Plan: POC SARS Coronavirus 2 Ag-ED - Nasal Swab, POC SARS Coronavirus 2 Ag-ED - Nasal Swab  Anxiety and depression - Plan: sertraline  (ZOLOFT ) 25 MG tablet  Medication refill   COVID testing done today was negative.  Refill sent in of your  Zoloft  25 mg daily.  Please schedule an  appointment as soon as possible to see your primary care provider as this medication needs to be managed by your primary care physician.  Return to urgent care or PCP as needed.    Final Clinical Impressions(s) / UC Diagnoses   Final diagnoses:  Encounter for screening for COVID-19  Medication refill     Discharge Instructions      COVID testing done today was negative.  Refill sent in of your Zoloft  25 mg daily.  Please schedule an appointment as soon as possible to see your primary care provider as this medication needs to be managed by your primary care physician.  Return to urgent care or PCP as needed.      ED Prescriptions     Medication Sig Dispense Auth. Provider   sertraline  (ZOLOFT ) 25 MG tablet Take 1 tablet (25 mg total) by mouth daily. 30 tablet Teresa Almarie LABOR, [place]      PDMP not reviewed this encounter.   Teresa Almarie LABOR, PA-C 07/13/24 1404

## 2024-07-13 NOTE — Discharge Instructions (Addendum)
 COVID testing done today was negative.  Refill sent in of your Zoloft  25 mg daily.  Please schedule an appointment as soon as possible to see your primary care provider as this medication needs to be managed by your primary care physician.  Return to urgent care or PCP as needed.

## 2024-07-13 NOTE — ED Triage Notes (Signed)
 Pt requesting to be tested for covid for work, states her daughter has covid now.
